# Patient Record
Sex: Male | Born: 1960 | Race: White | Hispanic: No | Marital: Married | Smoking: Never smoker
Health system: Southern US, Community
[De-identification: ages and names within clinical notes are randomized; demographics above are authoritative.]

## PROBLEM LIST (undated history)

## (undated) DIAGNOSIS — E785 Hyperlipidemia, unspecified: Secondary | ICD-10-CM

## (undated) DIAGNOSIS — C4491 Basal cell carcinoma of skin, unspecified: Secondary | ICD-10-CM

## (undated) DIAGNOSIS — C4492 Squamous cell carcinoma of skin, unspecified: Secondary | ICD-10-CM

## (undated) HISTORY — DX: Hyperlipidemia, unspecified: E78.5

## (undated) HISTORY — DX: Squamous cell carcinoma of skin, unspecified: C44.92

## (undated) HISTORY — DX: Basal cell carcinoma of skin, unspecified: C44.91

---

## 1989-10-30 DIAGNOSIS — C4491 Basal cell carcinoma of skin, unspecified: Secondary | ICD-10-CM

## 1989-10-30 HISTORY — DX: Basal cell carcinoma of skin, unspecified: C44.91

## 2009-03-26 HISTORY — PX: ARTHROSCOPIC REPAIR ACL: SUR80

## 2009-06-17 DIAGNOSIS — C4492 Squamous cell carcinoma of skin, unspecified: Secondary | ICD-10-CM

## 2009-06-17 HISTORY — DX: Squamous cell carcinoma of skin, unspecified: C44.92

## 2015-08-02 DIAGNOSIS — E7800 Pure hypercholesterolemia, unspecified: Secondary | ICD-10-CM | POA: Insufficient documentation

## 2015-08-02 DIAGNOSIS — E785 Hyperlipidemia, unspecified: Secondary | ICD-10-CM | POA: Insufficient documentation

## 2015-08-02 DIAGNOSIS — R7301 Impaired fasting glucose: Secondary | ICD-10-CM | POA: Insufficient documentation

## 2015-08-03 DIAGNOSIS — L659 Nonscarring hair loss, unspecified: Secondary | ICD-10-CM | POA: Insufficient documentation

## 2016-08-02 DIAGNOSIS — Z0000 Encounter for general adult medical examination without abnormal findings: Secondary | ICD-10-CM | POA: Diagnosis not present

## 2016-08-29 DIAGNOSIS — E7800 Pure hypercholesterolemia, unspecified: Secondary | ICD-10-CM | POA: Diagnosis not present

## 2016-08-29 DIAGNOSIS — L659 Nonscarring hair loss, unspecified: Secondary | ICD-10-CM | POA: Diagnosis not present

## 2016-08-29 DIAGNOSIS — Z0000 Encounter for general adult medical examination without abnormal findings: Secondary | ICD-10-CM | POA: Diagnosis not present

## 2017-03-31 DIAGNOSIS — Z23 Encounter for immunization: Secondary | ICD-10-CM | POA: Diagnosis not present

## 2017-05-17 ENCOUNTER — Other Ambulatory Visit: Payer: Self-pay

## 2017-05-17 DIAGNOSIS — Z125 Encounter for screening for malignant neoplasm of prostate: Secondary | ICD-10-CM

## 2017-05-17 DIAGNOSIS — Z0000 Encounter for general adult medical examination without abnormal findings: Secondary | ICD-10-CM

## 2017-05-17 DIAGNOSIS — Z13220 Encounter for screening for lipoid disorders: Secondary | ICD-10-CM

## 2017-06-07 ENCOUNTER — Other Ambulatory Visit: Payer: 59 | Admitting: Internal Medicine

## 2017-06-07 DIAGNOSIS — Z13220 Encounter for screening for lipoid disorders: Secondary | ICD-10-CM

## 2017-06-07 DIAGNOSIS — Z0000 Encounter for general adult medical examination without abnormal findings: Secondary | ICD-10-CM

## 2017-06-07 DIAGNOSIS — Z125 Encounter for screening for malignant neoplasm of prostate: Secondary | ICD-10-CM

## 2017-06-08 LAB — CBC WITH DIFFERENTIAL/PLATELET
BASOS PCT: 0.6 %
Basophils Absolute: 22 cells/uL (ref 0–200)
Eosinophils Absolute: 101 cells/uL (ref 15–500)
Eosinophils Relative: 2.8 %
HCT: 45 % (ref 38.5–50.0)
Hemoglobin: 15.7 g/dL (ref 13.2–17.1)
Lymphs Abs: 1325 cells/uL (ref 850–3900)
MCH: 29.7 pg (ref 27.0–33.0)
MCHC: 34.9 g/dL (ref 32.0–36.0)
MCV: 85.1 fL (ref 80.0–100.0)
MONOS PCT: 12.5 %
MPV: 10.9 fL (ref 7.5–12.5)
NEUTROS PCT: 47.3 %
Neutro Abs: 1703 cells/uL (ref 1500–7800)
PLATELETS: 219 10*3/uL (ref 140–400)
RBC: 5.29 10*6/uL (ref 4.20–5.80)
RDW: 13 % (ref 11.0–15.0)
TOTAL LYMPHOCYTE: 36.8 %
WBC mixed population: 450 cells/uL (ref 200–950)
WBC: 3.6 10*3/uL — AB (ref 3.8–10.8)

## 2017-06-08 LAB — LIPID PANEL
Cholesterol: 134 mg/dL (ref <200)
HDL: 40 mg/dL — ABNORMAL LOW (ref >40)
LDL Cholesterol (Calc): 80 mg/dL (calc)
NON-HDL CHOLESTEROL (CALC): 94 mg/dL (ref <130)
Total CHOL/HDL Ratio: 3.4 (calc) (ref <5.0)
Triglycerides: 49 mg/dL (ref <150)

## 2017-06-08 LAB — PSA: PSA: 0.1 ng/mL (ref <=4.0)

## 2017-06-08 LAB — COMPLETE METABOLIC PANEL WITH GFR
AG Ratio: 2.4 (calc) (ref 1.0–2.5)
ALT: 25 U/L (ref 9–46)
AST: 20 U/L (ref 10–35)
Albumin: 4.5 g/dL (ref 3.6–5.1)
Alkaline phosphatase (APISO): 56 U/L (ref 40–115)
BUN: 18 mg/dL (ref 7–25)
CALCIUM: 9.8 mg/dL (ref 8.6–10.3)
CHLORIDE: 104 mmol/L (ref 98–110)
CO2: 28 mmol/L (ref 20–32)
CREATININE: 1.14 mg/dL (ref 0.70–1.33)
GFR, EST AFRICAN AMERICAN: 83 mL/min/{1.73_m2} (ref >=60)
GFR, Est Non African American: 72 mL/min/{1.73_m2} (ref >=60)
Globulin: 1.9 g/dL (calc) (ref 1.9–3.7)
Glucose, Bld: 101 mg/dL — ABNORMAL HIGH (ref 65–99)
Potassium: 4.6 mmol/L (ref 3.5–5.3)
Sodium: 139 mmol/L (ref 135–146)
TOTAL PROTEIN: 6.4 g/dL (ref 6.1–8.1)
Total Bilirubin: 0.9 mg/dL (ref 0.2–1.2)

## 2017-06-11 ENCOUNTER — Ambulatory Visit (INDEPENDENT_AMBULATORY_CARE_PROVIDER_SITE_OTHER): Payer: 59 | Admitting: Internal Medicine

## 2017-06-11 ENCOUNTER — Encounter: Payer: Self-pay | Admitting: Internal Medicine

## 2017-06-11 VITALS — BP 120/80 | HR 80 | Ht 73.0 in | Wt 200.0 lb

## 2017-06-11 DIAGNOSIS — Z23 Encounter for immunization: Secondary | ICD-10-CM | POA: Diagnosis not present

## 2017-06-11 DIAGNOSIS — Z0000 Encounter for general adult medical examination without abnormal findings: Secondary | ICD-10-CM | POA: Diagnosis not present

## 2017-06-11 LAB — POCT URINALYSIS DIPSTICK
APPEARANCE: NORMAL
BILIRUBIN UA: NEGATIVE
Glucose, UA: NEGATIVE
Ketones, UA: NEGATIVE
Leukocytes, UA: NEGATIVE
NITRITE UA: NEGATIVE
ODOR: NORMAL
PROTEIN UA: NEGATIVE
Spec Grav, UA: 1.015 (ref 1.010–1.025)
UROBILINOGEN UA: 0.2 U/dL
pH, UA: 6.5 (ref 5.0–8.0)

## 2017-06-11 NOTE — Patient Instructions (Signed)
Pleasure to see you today.  Continue Lipitor and return in 1 year or as needed.  Tetanus immunization update given today.

## 2017-06-11 NOTE — Progress Notes (Signed)
   Subjective:    Patient ID: Steve Huynh, male    DOB: 09-22-1960, 58 y.o.   MRN: [id number]  HPI 57 year old male presents to the office for the first time history of hyperlipidemia treated with Lipitor.  History of low HDL.  General health is excellent.  He had a colonoscopy around age 62 by Dr. Watt Climes with 10-year follow-up recommended.  Past medical history no history of serious illnesses or accidents.  Had a torn ACL repair in 2011.  Current medications include Lipitor 40 mg daily, aspirin daily and Propecia 1 mg daily.  Social history: He is married.  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  Does not smoke.  One daughter and 2 sons.  Family history: Father died at age 22 due to renal failure.  Mother living age 21 in good health.  3 brothers in good health.  Review of lab work shows normal lipid panel on current dose of Lipitor and normal liver functions.  He has a low HDL at 40.  PSA is normal.    Review of Systems  Constitutional: Negative.   All other systems reviewed and are negative.      Objective:   Physical Exam  Constitutional: He is oriented to person, place, and time. He appears well-developed and well-nourished. No distress.  HENT:  Head: Normocephalic and atraumatic.  Right Ear: External ear normal.  Left Ear: External ear normal.  Nose: Nose normal.  Mouth/Throat: Oropharynx is clear and moist.  Eyes: Conjunctivae are normal. Right eye exhibits no discharge. Left eye exhibits no discharge. No scleral icterus.  Neck: Neck supple. No JVD present. No thyromegaly present.  Cardiovascular: Normal rate, regular rhythm, normal heart sounds and intact distal pulses.  No murmur heard. No carotid bruits  Pulmonary/Chest: Effort normal and breath sounds normal. He has no wheezes. He has no rales.  Abdominal: Soft. Bowel sounds are normal. He exhibits no distension and no mass. There is no  tenderness. There is no rebound and no guarding.  Genitourinary: Prostate normal.  Genitourinary Comments: Prostate is normal without nodules  Musculoskeletal: He exhibits no edema.  Lymphadenopathy:    He has no cervical adenopathy.  Neurological: He is alert and oriented to person, place, and time. He has normal reflexes. No cranial nerve deficit. Coordination normal.  Skin: Skin is warm and dry. No rash noted. He is not diaphoretic.  Psychiatric: He has a normal mood and affect. His behavior is normal. Judgment and thought content normal.  Vitals reviewed.         Assessment & Plan:  History of hyperlipidemia on Lipitor with normal lipid panel  Low HDL cholesterol  Plan: His general health is excellent and he watches his weight and works out.  Continue same dose of Lipitor and return in 1 year or as needed.  Tetanus immunization update given today.  Order given for Zoster vaccine should he desire to get that at local pharmacy.

## 2017-08-15 ENCOUNTER — Telehealth: Payer: Self-pay | Admitting: Internal Medicine

## 2017-08-15 ENCOUNTER — Ambulatory Visit
Admission: RE | Admit: 2017-08-15 | Discharge: 2017-08-15 | Disposition: A | Discharge status: Home / Self Care | Payer: 59 | Source: Ambulatory Visit | Attending: Internal Medicine | Admitting: Internal Medicine

## 2017-08-15 ENCOUNTER — Ambulatory Visit: Payer: 59 | Admitting: Internal Medicine

## 2017-08-15 VITALS — BP 110/78 | HR 118 | Temp 98.4°F | Ht 73.0 in | Wt 200.0 lb

## 2017-08-15 DIAGNOSIS — R52 Pain, unspecified: Secondary | ICD-10-CM

## 2017-08-15 DIAGNOSIS — R05 Cough: Secondary | ICD-10-CM | POA: Diagnosis not present

## 2017-08-15 DIAGNOSIS — M791 Myalgia, unspecified site: Secondary | ICD-10-CM

## 2017-08-15 DIAGNOSIS — R059 Cough, unspecified: Secondary | ICD-10-CM

## 2017-08-15 DIAGNOSIS — B349 Viral infection, unspecified: Secondary | ICD-10-CM

## 2017-08-15 LAB — CBC WITH DIFFERENTIAL/PLATELET
BASOS PCT: 0.2 %
Basophils Absolute: 17 cells/uL (ref 0–200)
EOS PCT: 1.1 %
Eosinophils Absolute: 94 cells/uL (ref 15–500)
HEMATOCRIT: 45.4 % (ref 38.5–50.0)
Hemoglobin: 16.1 g/dL (ref 13.2–17.1)
LYMPHS ABS: 1301 {cells}/uL (ref 850–3900)
MCH: 30.1 pg (ref 27.0–33.0)
MCHC: 35.5 g/dL (ref 32.0–36.0)
MCV: 84.9 fL (ref 80.0–100.0)
MPV: 10.6 fL (ref 7.5–12.5)
Monocytes Relative: 9.3 %
Neutro Abs: 6299 cells/uL (ref 1500–7800)
Neutrophils Relative %: 74.1 %
PLATELETS: 206 10*3/uL (ref 140–400)
RBC: 5.35 10*6/uL (ref 4.20–5.80)
RDW: 12.5 % (ref 11.0–15.0)
Total Lymphocyte: 15.3 %
WBC: 8.5 10*3/uL (ref 3.8–10.8)
WBCMIX: 791 {cells}/uL (ref 200–950)

## 2017-08-15 LAB — POCT INFLUENZA A/B
INFLUENZA B, POC: NEGATIVE
Influenza A, POC: NEGATIVE

## 2017-08-15 MED ORDER — CEFTRIAXONE SODIUM 1 G IJ SOLR
1.0000 g | Freq: Once | INTRAMUSCULAR | Status: AC
  Administered 2017-08-15: 1 g via INTRAMUSCULAR

## 2017-08-15 MED ORDER — AZITHROMYCIN 250 MG PO TABS: ORAL_TABLET | ORAL | 0 refills | Status: DC

## 2017-08-15 NOTE — Telephone Encounter (Signed)
Vinicius Brockman Spouse [phone]  Shirlean Mylar called to say that Steve Huynh is having flu like symptoms since yesterday, but started getting a cold on Saturday. He came home from work yesterday saying that he just does not feel good, he feels like he may have fever, she is on the way to drug store to get a thermometer, he also has a dry cough, achy all over feels really tired. Can come in anytime.

## 2017-08-15 NOTE — Telephone Encounter (Signed)
Called back and spoke with Robin and scheduled appointment

## 2017-08-15 NOTE — Telephone Encounter (Signed)
See afternoon 2 pm? Mask do not leave in waiting room

## 2017-08-18 ENCOUNTER — Encounter: Payer: Self-pay | Admitting: Internal Medicine

## 2017-08-18 NOTE — Patient Instructions (Signed)
Please go and have chest x-ray.  Take Zithromax Z-PAK 2 p.o. day 1 followed by 1 p.o. days 2 through 5.  Tylenol as needed for myalgias and headache.  Rocephin 1 g IM given in office.  Lab work drawn and pending.  Rapid flu test is negative.

## 2017-08-18 NOTE — Progress Notes (Signed)
   Subjective:    Patient ID: Steve Huynh, male    DOB: 1960/07/22, 57 y.o.   MRN: [id number]  HPI Steve Huynh recently traveled to [place] and returning home began to have malaise, myalgias, chills and cough.  Today he feels worse not better.  No energy.  Rapid flu test is negative.  CBC with differential is within normal limits and chest x-ray is negative for pneumonia.  Not sleeping well due to cough ,malaise and myalgias.    Review of Systems see above     Objective:   Physical Exam He looks fatigued.  Skin warm and dry.  Nodes none.  Pharynx very slightly injected without exudate.  TMs are clear.  Neck is supple.  Chest clear to auscultation.       Assessment & Plan:  Acute viral syndrome with early secondary bacterial infection  Plan: 1 g IM Rocephin.  Zithromax Z-Pak take 2 tabs day 1 followed by 1 tablet days 2 through 5.  Rest and drink plenty of fluids.  Patient contacted with results of lab work and chest x-ray after visit by voicemail.

## 2017-11-10 DIAGNOSIS — Z23 Encounter for immunization: Secondary | ICD-10-CM | POA: Diagnosis not present

## 2018-01-08 ENCOUNTER — Other Ambulatory Visit: Payer: Self-pay | Admitting: Internal Medicine

## 2018-01-15 DIAGNOSIS — L570 Actinic keratosis: Secondary | ICD-10-CM | POA: Diagnosis not present

## 2018-01-27 ENCOUNTER — Telehealth: Payer: Self-pay | Admitting: Emergency Medicine

## 2018-01-27 NOTE — Telephone Encounter (Signed)
Pt called and stated he needs a PA for his medications atorvastatin (LIPITOR) 40 MG tablet and finasteride (PROPECIA) 1 MG tablet. Pharmacy is [facility] on [address]. Thanks.

## 2018-01-27 NOTE — Telephone Encounter (Signed)
Please see if this is possible. If cannot get Propecia approved for hair loss, will need to see his Dermatologist

## 2018-01-29 ENCOUNTER — Telehealth: Payer: Self-pay | Admitting: Internal Medicine

## 2018-01-29 MED ORDER — ATORVASTATIN CALCIUM 40 MG PO TABS: 40.0000 mg | ORAL_TABLET | Freq: Every day | ORAL | 5 refills | Status: DC

## 2018-01-29 MED ORDER — FINASTERIDE 1 MG PO TABS: 1.0000 mg | ORAL_TABLET | Freq: Every day | ORAL | 5 refills | Status: DC

## 2018-01-29 NOTE — Telephone Encounter (Signed)
Patient calling for a refill on Lipitor and Propecia.  He had his CPE on March.  States to return in 1 year.  Can we go ahead and send these 2 Rx's in for him?      Pharmacy:  [facility] @ [place].    Phone:  [phone]  Thank you.

## 2018-01-29 NOTE — Telephone Encounter (Signed)
Sharyn Lull, we received these requests on Monday. Please ask [facility] to handle. It should have been handled by now.

## 2018-01-29 NOTE — Telephone Encounter (Signed)
Steve Huynh, will you please send these 2 Rx's for patient?    Thank you.  I'll call the patient and let him know that they have been sent.

## 2018-03-04 DIAGNOSIS — D229 Melanocytic nevi, unspecified: Secondary | ICD-10-CM | POA: Diagnosis not present

## 2018-03-04 DIAGNOSIS — L570 Actinic keratosis: Secondary | ICD-10-CM | POA: Diagnosis not present

## 2018-04-06 DIAGNOSIS — R69 Illness, unspecified: Secondary | ICD-10-CM | POA: Diagnosis not present

## 2018-04-06 DIAGNOSIS — R05 Cough: Secondary | ICD-10-CM | POA: Diagnosis not present

## 2018-04-07 ENCOUNTER — Encounter: Payer: Self-pay | Admitting: Internal Medicine

## 2018-04-07 ENCOUNTER — Ambulatory Visit (INDEPENDENT_AMBULATORY_CARE_PROVIDER_SITE_OTHER): Payer: 59 | Admitting: Internal Medicine

## 2018-04-07 VITALS — BP 140/90 | HR 122 | Temp 99.5°F | Ht 73.0 in | Wt 194.0 lb

## 2018-04-07 DIAGNOSIS — R509 Fever, unspecified: Secondary | ICD-10-CM | POA: Diagnosis not present

## 2018-04-07 DIAGNOSIS — J111 Influenza due to unidentified influenza virus with other respiratory manifestations: Secondary | ICD-10-CM | POA: Diagnosis not present

## 2018-04-07 DIAGNOSIS — R0989 Other specified symptoms and signs involving the circulatory and respiratory systems: Secondary | ICD-10-CM | POA: Diagnosis not present

## 2018-04-07 DIAGNOSIS — R52 Pain, unspecified: Secondary | ICD-10-CM | POA: Diagnosis not present

## 2018-04-07 LAB — CBC WITH DIFFERENTIAL/PLATELET
ABSOLUTE MONOCYTES: 1254 {cells}/uL — AB (ref 200–950)
Basophils Absolute: 8 cells/uL (ref 0–200)
Basophils Relative: 0.1 %
Eosinophils Absolute: 0 cells/uL — ABNORMAL LOW (ref 15–500)
Eosinophils Relative: 0 %
HCT: 46.8 % (ref 38.5–50.0)
Hemoglobin: 16.1 g/dL (ref 13.2–17.1)
Lymphs Abs: 1110 cells/uL (ref 850–3900)
MCH: 29.7 pg (ref 27.0–33.0)
MCHC: 34.4 g/dL (ref 32.0–36.0)
MCV: 86.2 fL (ref 80.0–100.0)
MPV: 11.4 fL (ref 7.5–12.5)
Monocytes Relative: 16.5 %
Neutro Abs: 5229 cells/uL (ref 1500–7800)
Neutrophils Relative %: 68.8 %
Platelets: 188 10*3/uL (ref 140–400)
RBC: 5.43 10*6/uL (ref 4.20–5.80)
RDW: 12.6 % (ref 11.0–15.0)
Total Lymphocyte: 14.6 %
WBC: 7.6 10*3/uL (ref 3.8–10.8)

## 2018-04-07 LAB — POCT INFLUENZA A/B
Influenza A, POC: NEGATIVE
Influenza B, POC: NEGATIVE

## 2018-04-07 MED ORDER — HYDROCODONE-HOMATROPINE 5-1.5 MG/5ML PO SYRP: 5.0000 mL | ORAL_SOLUTION | Freq: Three times a day (TID) | ORAL | 0 refills | Status: DC | PRN

## 2018-04-07 MED ORDER — DOXYCYCLINE HYCLATE 100 MG PO TABS: 100.0000 mg | ORAL_TABLET | Freq: Two times a day (BID) | ORAL | 0 refills | Status: DC

## 2018-04-07 MED ORDER — CEFTRIAXONE SODIUM 1 G IJ SOLR
1.0000 g | Freq: Once | INTRAMUSCULAR | Status: AC
  Administered 2018-04-07: 1 g via INTRAMUSCULAR

## 2018-04-07 NOTE — Patient Instructions (Addendum)
Rocephin 1 g IM.  Doxycycline 100 mg twice daily for 10 days.  Hycodan 1 teaspoon p.o. every 8 hours as needed cough.  Tylenol as needed for fever.  Rest and drink plenty of fluids.  Have chest x-ray.  CBC pending.

## 2018-04-07 NOTE — Progress Notes (Signed)
   Subjective:    Patient ID: Steve Huynh, male    DOB: 14-Aug-1960, 58 y.o.   MRN: [id number]  HPI 58 year old Male in with headache and cough onset  this past Friday before leaving [place], [place] where he and his wife were vacationing.  He also has had myalgias.  He has slept a great deal of the past 2 days having some difficulty getting back from [place] due to airplane delays.  Has deep congested cough without much sputum production.  He went to [facility] yesterday and was placed on anti-flu medication.  He started that.  They did not have a rapid flu test as they were out of stock.  His rapid flu test today here is negative.  He was placed on Xofluza  40 mg to take 2 tablets for one-time dosing which he did yesterday.   Review of Systems no nausea and vomiting.  Has malaise and fatigue.  Has headache.  Sinuses feel full.     Objective:   Physical Exam Skin warm and dry.  He sounds nasally congested.  He has a deep congested cough.  Vital signs reviewed.  Pulse is 122.  Temperature is 99.5 degrees orally.  Pulse oximetry is 99%.  Skin warm and dry.  Nodes none.  Left TM is slightly full.  Pharynx without exudate.  Neck is supple.  Chest: Inspiratory rales right lower lobe.  Chest x-ray ordered.       Assessment & Plan:  I suspect he has influenza.  He has been treated at [facility] yesterday.  He actually had onset of symptoms on January 10.  I think he may have pneumonia in the right lower lobe.  He will have chest x-ray.  Gave him 1 g IM Rocephin and started him on doxycycline 100 mg twice daily for 10 days.  Hycodan 1 teaspoon p.o. every 8 hours as needed cough and myalgias.  Tylenol for fever.  Rest and drink plenty of fluids.

## 2018-04-08 ENCOUNTER — Ambulatory Visit
Admission: RE | Admit: 2018-04-08 | Discharge: 2018-04-08 | Disposition: A | Discharge status: Home / Self Care | Payer: 59 | Source: Ambulatory Visit | Attending: Internal Medicine | Admitting: Internal Medicine

## 2018-04-08 DIAGNOSIS — R05 Cough: Secondary | ICD-10-CM | POA: Diagnosis not present

## 2018-04-22 ENCOUNTER — Other Ambulatory Visit: Payer: Self-pay

## 2018-04-22 MED ORDER — AZITHROMYCIN 250 MG PO TABS: ORAL_TABLET | ORAL | 0 refills | Status: DC

## 2018-04-22 NOTE — Telephone Encounter (Signed)
Please refill.

## 2018-04-22 NOTE — Telephone Encounter (Signed)
Patient is calling to let you know that he is still coughing and would like to know if you could send in a zpak for him, he said his wife talked to you about this.

## 2018-05-06 ENCOUNTER — Other Ambulatory Visit: Payer: Self-pay

## 2018-05-06 MED ORDER — AZITHROMYCIN 250 MG PO TABS: ORAL_TABLET | ORAL | 0 refills | Status: DC

## 2018-06-18 ENCOUNTER — Encounter: Payer: Self-pay | Admitting: *Deleted

## 2018-07-21 ENCOUNTER — Other Ambulatory Visit: Payer: Self-pay | Admitting: Internal Medicine

## 2018-10-03 ENCOUNTER — Ambulatory Visit (INDEPENDENT_AMBULATORY_CARE_PROVIDER_SITE_OTHER): Payer: 59 | Admitting: Internal Medicine

## 2018-10-03 ENCOUNTER — Other Ambulatory Visit: Payer: Self-pay | Admitting: Internal Medicine

## 2018-10-03 ENCOUNTER — Telehealth: Payer: Self-pay | Admitting: Internal Medicine

## 2018-10-03 DIAGNOSIS — Z20828 Contact with and (suspected) exposure to other viral communicable diseases: Secondary | ICD-10-CM | POA: Diagnosis not present

## 2018-10-03 DIAGNOSIS — Z20822 Contact with and (suspected) exposure to covid-19: Secondary | ICD-10-CM

## 2018-10-03 NOTE — Telephone Encounter (Signed)
Spoke with Steve Huynh at [place] testing site and she put in order and arranged testing for Monday at 8:30 for Dr Renold Genta. Patient was exposed after with spending week at beach with nephew that tested positive for COVID.

## 2018-10-03 NOTE — Telephone Encounter (Signed)
Spoke with Steve Huynh about his being exposed to COVID thru being at [employer] with his nephew. Arranged virtual visit to discuss with Dr Renold Genta.

## 2018-10-06 ENCOUNTER — Encounter: Payer: Self-pay | Admitting: Internal Medicine

## 2018-10-06 ENCOUNTER — Other Ambulatory Visit: Payer: 59

## 2018-10-06 ENCOUNTER — Other Ambulatory Visit: Payer: Self-pay | Admitting: Internal Medicine

## 2018-10-06 DIAGNOSIS — Z20822 Contact with and (suspected) exposure to covid-19: Secondary | ICD-10-CM

## 2018-10-06 NOTE — Progress Notes (Signed)
   Subjective:    Patient ID: Steve Huynh, male    DOB: 1960-06-11, 58 y.o.   MRN: [id number]  HPI 58 year old Male seen today by interactive audio and video telecommunications due to the coronavirus pandemic.  Patient is identified using 2 identifiers as "went Yaney, a patient in this practice.  He is agreeable to visit in this format today as he is at the beach.  Patient was at the beach with his family staying in a beach home for several days and apparently his wife's nephew came from [place] subsequently developing symptoms of COVID-19 during their stay.  Initially nephew thought he had an ear infection but came down with full-blown symptoms of fever chills myalgias on Sunday, July 5 and returned to [place].  Patient and his entire family were exposed to this nephew as he was residing at their beach home.  He did stay in a separate part of the house but was around them some of the time.  Mr. Bubeck is currently asymptomatic but would like to be tested.    Review of Systems no nausea vomiting diarrhea headache fever chills or myalgias     Objective:   Physical Exam Seen virtually in no acute distress       Assessment & Plan:  COVID-19 exposure  Plan: Patient will be sent for COVID-19 testing at test center on [facility].  15 minutes spent with patient including medical decision making, history taking and arranging for testing at [facility] center.

## 2018-10-06 NOTE — Patient Instructions (Signed)
Arrange for testing at [facility] test center for COVID-19.  Quarantine until results are back.

## 2018-10-10 ENCOUNTER — Encounter: Payer: Self-pay | Admitting: Internal Medicine

## 2018-10-10 ENCOUNTER — Telehealth: Payer: Self-pay | Admitting: Internal Medicine

## 2018-10-10 LAB — NOVEL CORONAVIRUS, NAA: SARS-CoV-2, NAA: NOT DETECTED

## 2018-10-10 NOTE — Telephone Encounter (Signed)
Call to pt. Covid 19 result is negative

## 2019-01-19 ENCOUNTER — Other Ambulatory Visit: Payer: Self-pay

## 2019-01-19 ENCOUNTER — Telehealth: Payer: Self-pay | Admitting: Internal Medicine

## 2019-01-19 MED ORDER — ATORVASTATIN CALCIUM 40 MG PO TABS: ORAL_TABLET | ORAL | 5 refills | Status: DC

## 2019-01-19 MED ORDER — FINASTERIDE 1 MG PO TABS: ORAL_TABLET | ORAL | 5 refills | Status: DC

## 2019-01-19 NOTE — Telephone Encounter (Addendum)
Received Fax RX request from  [place]  Medication - finasteride (PROPECIA) 1 MG tablet /  atorvastatin (LIPITOR) 40 MG tablet    Last Refill - 12/19/18  Last OV - 04/07/18  Last CPE - 06/11/17

## 2019-03-18 IMAGING — CR DG CHEST 2V
2 series · 2 of 2 positions shown · non-contrast
Comparison: None.

CLINICAL DATA: Productive cough, fever.

EXAM:
CHEST - 2 VIEW

[w chest pa]
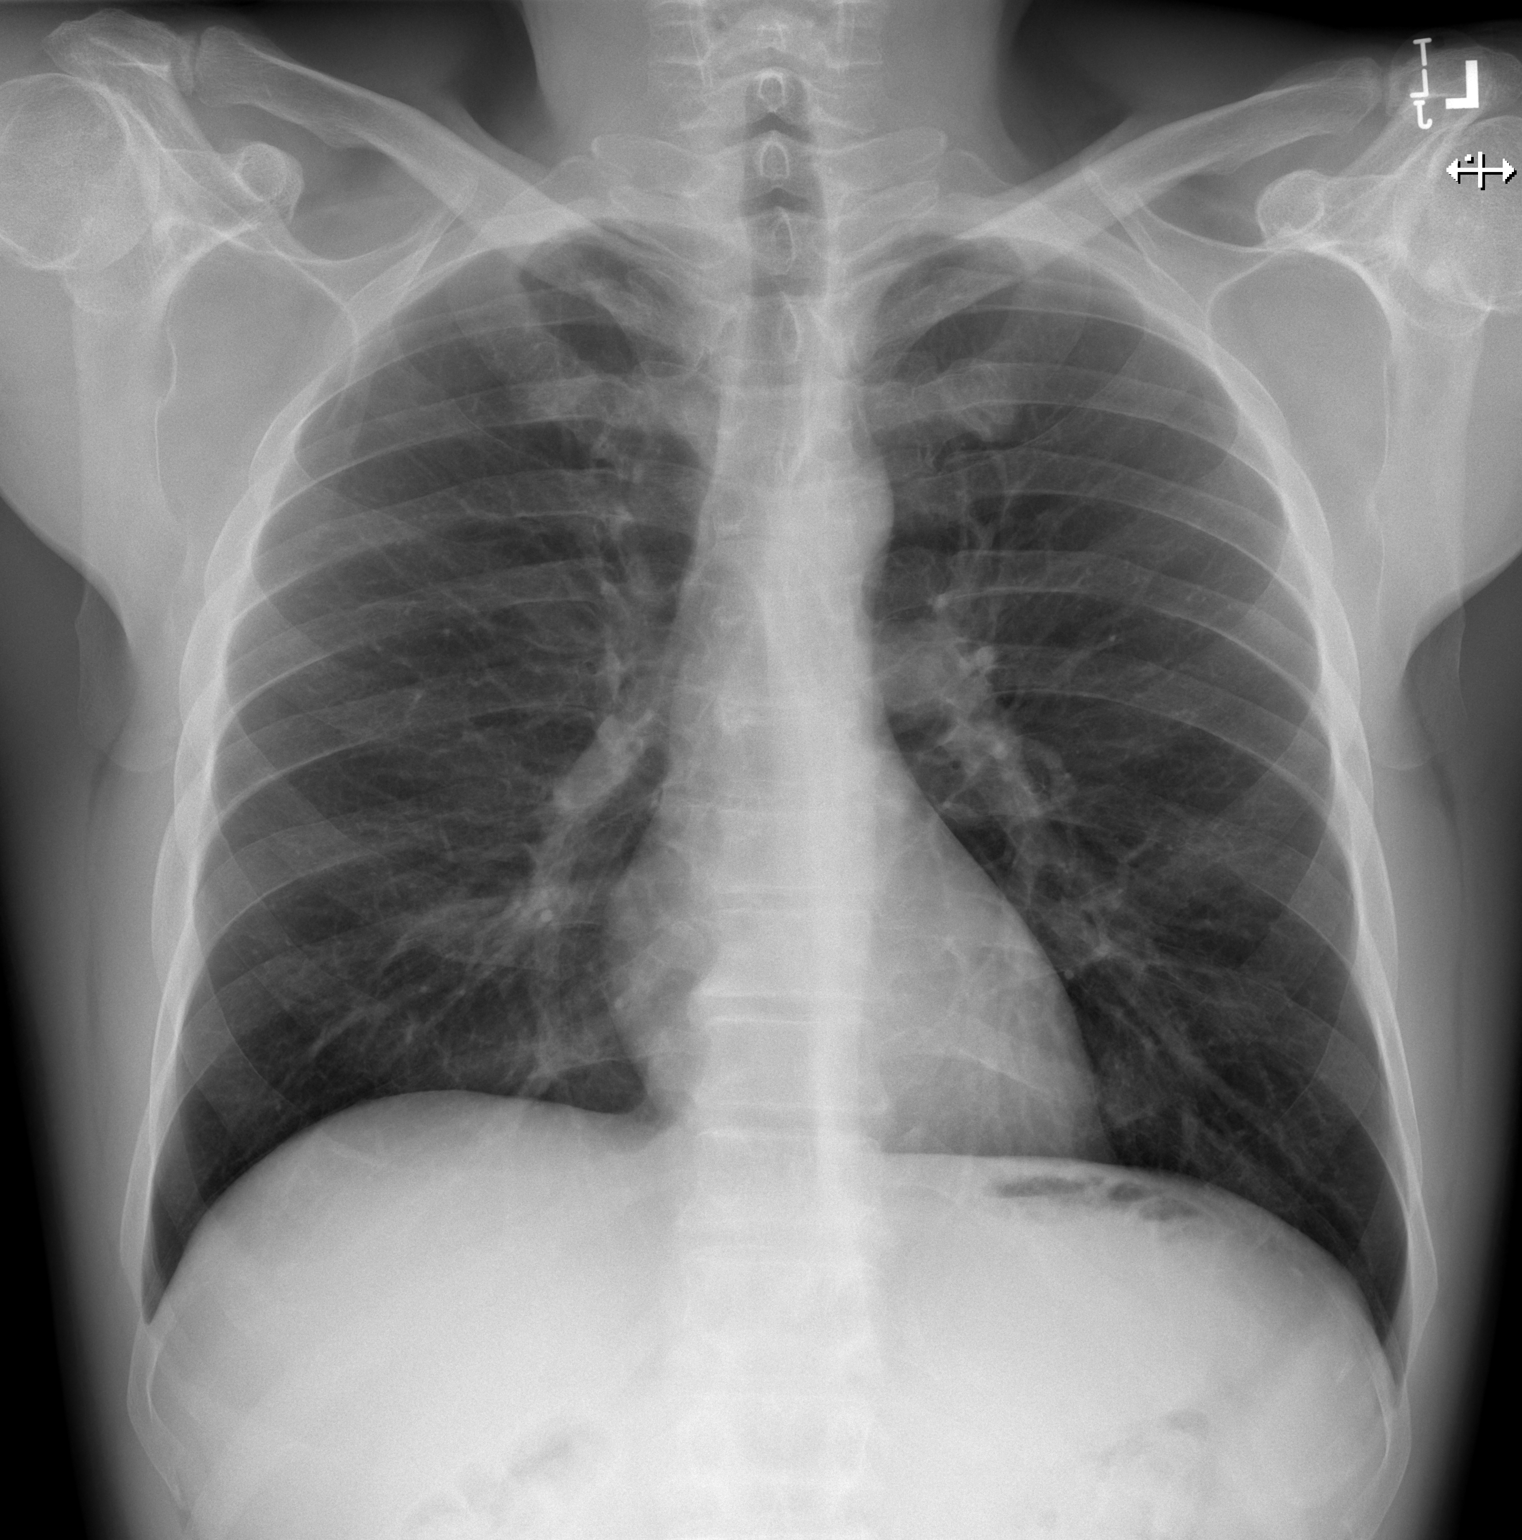

[w chest lat]
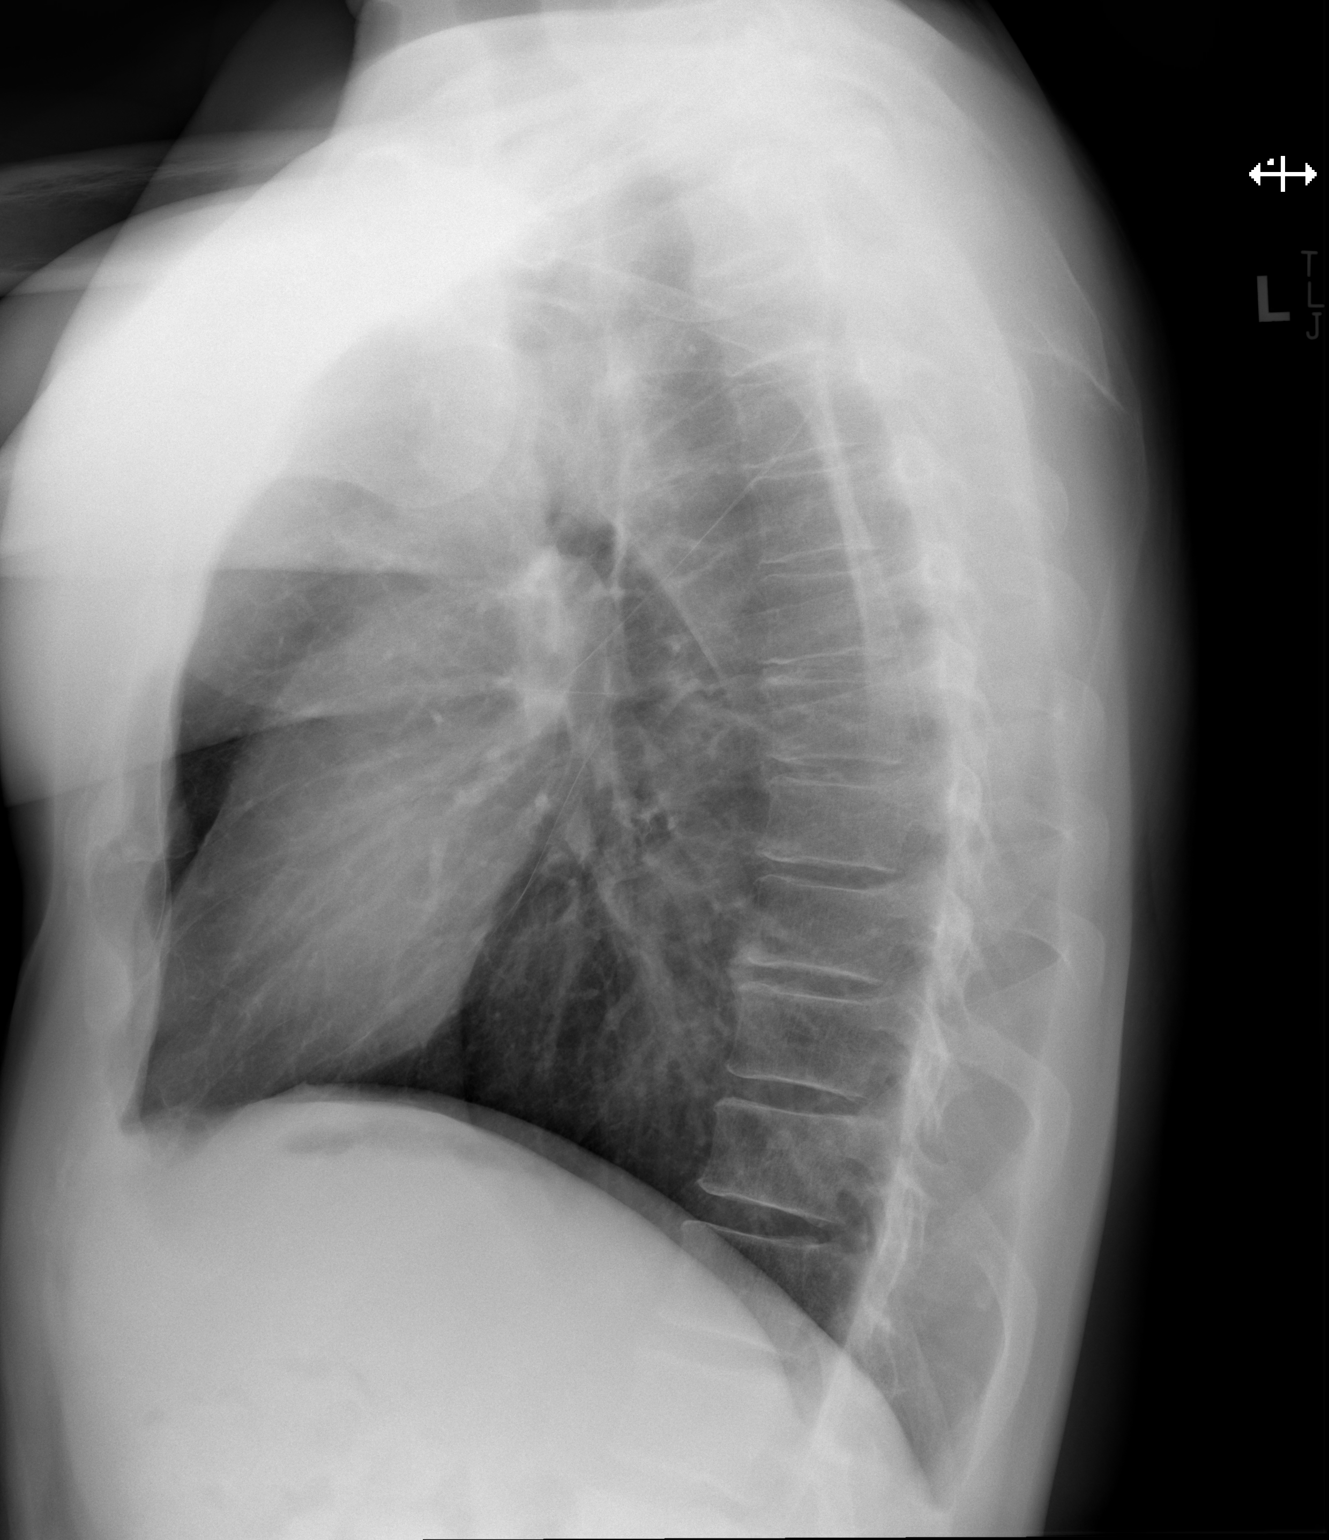

[2 of 2 positions shown; findings below may reference images not displayed]

FINDINGS: The heart size and mediastinal contours are within normal limits.
Both lungs are clear. No pneumothorax or pleural effusion is noted.
The visualized skeletal structures are unremarkable.
IMPRESSION: No active cardiopulmonary disease.

## 2019-04-21 ENCOUNTER — Other Ambulatory Visit: Payer: Self-pay

## 2019-04-21 ENCOUNTER — Other Ambulatory Visit: Payer: 59 | Admitting: Internal Medicine

## 2019-04-21 DIAGNOSIS — Z13220 Encounter for screening for lipoid disorders: Secondary | ICD-10-CM

## 2019-04-21 DIAGNOSIS — Z125 Encounter for screening for malignant neoplasm of prostate: Secondary | ICD-10-CM

## 2019-04-21 DIAGNOSIS — R7309 Other abnormal glucose: Secondary | ICD-10-CM

## 2019-04-21 DIAGNOSIS — Z0000 Encounter for general adult medical examination without abnormal findings: Secondary | ICD-10-CM

## 2019-04-22 LAB — LIPID PANEL
Cholesterol: 122 mg/dL (ref <200)
HDL: 39 mg/dL — ABNORMAL LOW (ref >=40)
LDL Cholesterol (Calc): 70 mg/dL (calc)
Non-HDL Cholesterol (Calc): 83 mg/dL (calc) (ref <130)
Total CHOL/HDL Ratio: 3.1 (calc) (ref <5.0)
Triglycerides: 57 mg/dL (ref <150)

## 2019-04-22 LAB — CBC WITH DIFFERENTIAL/PLATELET
Absolute Monocytes: 460 cells/uL (ref 200–950)
Basophils Absolute: 20 cells/uL (ref 0–200)
Basophils Relative: 0.5 %
Eosinophils Absolute: 140 cells/uL (ref 15–500)
Eosinophils Relative: 3.6 %
HCT: 44.6 % (ref 38.5–50.0)
Hemoglobin: 15.2 g/dL (ref 13.2–17.1)
Lymphs Abs: 1669 cells/uL (ref 850–3900)
MCH: 30.3 pg (ref 27.0–33.0)
MCHC: 34.1 g/dL (ref 32.0–36.0)
MCV: 89 fL (ref 80.0–100.0)
MPV: 11.4 fL (ref 7.5–12.5)
Monocytes Relative: 11.8 %
Neutro Abs: 1611 cells/uL (ref 1500–7800)
Neutrophils Relative %: 41.3 %
Platelets: 198 10*3/uL (ref 140–400)
RBC: 5.01 10*6/uL (ref 4.20–5.80)
RDW: 12.2 % (ref 11.0–15.0)
Total Lymphocyte: 42.8 %
WBC: 3.9 10*3/uL (ref 3.8–10.8)

## 2019-04-22 LAB — PSA: PSA: <0.1 ng/mL (ref <=4.0)

## 2019-04-22 LAB — COMPLETE METABOLIC PANEL WITH GFR
AG Ratio: 2 (calc) (ref 1.0–2.5)
ALT: 24 U/L (ref 9–46)
AST: 22 U/L (ref 10–35)
Albumin: 4.3 g/dL (ref 3.6–5.1)
Alkaline phosphatase (APISO): 60 U/L (ref 35–144)
BUN: 18 mg/dL (ref 7–25)
CO2: 26 mmol/L (ref 20–32)
Calcium: 9.6 mg/dL (ref 8.6–10.3)
Chloride: 106 mmol/L (ref 98–110)
Creat: 1.12 mg/dL (ref 0.70–1.33)
GFR, Est African American: 84 mL/min/{1.73_m2} (ref >=60)
GFR, Est Non African American: 73 mL/min/{1.73_m2} (ref >=60)
Globulin: 2.1 g/dL (calc) (ref 1.9–3.7)
Glucose, Bld: 98 mg/dL (ref 65–99)
Potassium: 4.3 mmol/L (ref 3.5–5.3)
Sodium: 142 mmol/L (ref 135–146)
Total Bilirubin: 0.8 mg/dL (ref 0.2–1.2)
Total Protein: 6.4 g/dL (ref 6.1–8.1)

## 2019-04-22 LAB — HEMOGLOBIN A1C
Hgb A1c MFr Bld: 5.3 % of total Hgb (ref <5.7)
Mean Plasma Glucose: 105 (calc)
eAG (mmol/L): 5.8 (calc)

## 2019-04-23 ENCOUNTER — Ambulatory Visit (INDEPENDENT_AMBULATORY_CARE_PROVIDER_SITE_OTHER): Payer: 59 | Admitting: Internal Medicine

## 2019-04-23 ENCOUNTER — Encounter: Payer: Self-pay | Admitting: Internal Medicine

## 2019-04-23 ENCOUNTER — Other Ambulatory Visit: Payer: Self-pay

## 2019-04-23 VITALS — BP 140/90 | HR 73 | Temp 97.9°F | Ht 73.0 in | Wt 196.0 lb

## 2019-04-23 DIAGNOSIS — Z8249 Family history of ischemic heart disease and other diseases of the circulatory system: Secondary | ICD-10-CM | POA: Diagnosis not present

## 2019-04-23 DIAGNOSIS — Z0000 Encounter for general adult medical examination without abnormal findings: Secondary | ICD-10-CM

## 2019-04-23 DIAGNOSIS — Z8639 Personal history of other endocrine, nutritional and metabolic disease: Secondary | ICD-10-CM

## 2019-04-23 DIAGNOSIS — E785 Hyperlipidemia, unspecified: Secondary | ICD-10-CM

## 2019-04-23 DIAGNOSIS — E786 Lipoprotein deficiency: Secondary | ICD-10-CM | POA: Diagnosis not present

## 2019-04-23 LAB — POCT URINALYSIS DIPSTICK
Appearance: NEGATIVE
Bilirubin, UA: NEGATIVE
Blood, UA: NEGATIVE
Glucose, UA: NEGATIVE
Ketones, UA: NEGATIVE
Leukocytes, UA: NEGATIVE
Nitrite, UA: NEGATIVE
Odor: NEGATIVE
Protein, UA: NEGATIVE
Spec Grav, UA: 1.01 (ref 1.010–1.025)
Urobilinogen, UA: 0.2 E.U./dL
pH, UA: 6.5 (ref 5.0–8.0)

## 2019-04-23 NOTE — Patient Instructions (Addendum)
It was a pleasure to see you today.  Continue lipid-lowering medication.  Return in 1 year or as needed.

## 2019-04-23 NOTE — Progress Notes (Addendum)
Subjective:    Patient ID: Steve Huynh, male    DOB: 1961-01-18, 59 y.o.   MRN: [id number]  HPI pleasant 59 year old male in today for health maintenance exam and evaluation of medical issues.  He feels well and has no complaints.  His general health is excellent.  He recently saw Dr. Morton Stall at [facility] clinic in [place] on January 19.  He was seen by an exercise physiologist to review his exercise plan.  He was seen by dietitian and behavioral therapist.  This was a corporate sponsored comprehensive health exam.  He has takes Lipitor 40 mg daily and Propecia he exercises regularly.  Social history: Married.  1 daughter and 2 sons.  Social alcohol consumption, typically during social events only.  Non-smoker.  He works out with a Physiological scientist twice a week.  Enjoys skiing.  Recently was in [place] skiing over the holidays.  He has a degree in IT sales professional from [employer] and an [employer] [facility] from [employer].  His daughter is 54 years old and he has 2 sons ages 82 and 54. He is owner of [employer].  Records indicate he started on Lipitor in 2007 after previous physical.  Had ACL repair from skiing accident some 10 years ago.  There is history of basal cell carcinoma of skin followed by dermatology on a regular basis.  Family history: Father died at age 89 with renal failure with history of coronary artery disease.  Father had angioplasty at 88 and stroke at 41.  Mother living age 64 in good health.  3 brothers, an older brother who is in good health and twin brothers age 46 in good health.  Recently there was a chest x-ray done at [facility] during his executive physical and there was a possible left upper lobe nodule but dual-energy chest radiography evaluated this showing upper lobe nodule at all.  EKG was normal.  Flu and tetanus immunizations are up-to-date.  In July he had close exposure to COVID-19 as his wife nephew came from [place] to stay with him at [employer] and came down with COVID-19.  Mr. Winski tested negative but one of his sons tested positive but was basically asymptomatic.  He was diagnosed with influenza in January 2020.    Review of Systems  Constitutional: Negative.   Respiratory: Negative.   Cardiovascular: Negative.   Gastrointestinal: Negative.   Genitourinary: Negative.   Neurological: Negative.   Psychiatric/Behavioral: Negative.        Objective:   Physical Exam Blood pressure 140/90 BMI 25.86 weight 196 pounds pulse 73 temperature 97.9 degrees pulse oximetry 97% height 6 feet 1 inches skin warm and dry.  Nodes none.  TMs are clear.  Neck is supple without JVD thyromegaly or carotid bruits.  Chest clear to auscultation.  Cardiac exam regular rate and rhythm normal S1 and S2.  Abdomen no hepatosplenomegaly masses or tenderness.  Extremities no deformity.  No lower extremity edema.  Neuro no focal deficits on brief neurological exam.  Prostate is slightly boggy but no nodules appreciated.  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  He exercises regularly which is helpful.  He remains on statin medication due to family history and history of hyperlipidemia.  Hemoglobin A1c PSA CBC C met are reviewed and are all within normal limits.       Assessment & Plan:  History of hyperlipidemia   low HDL cholesterol  History of basal cell carcinoma  followed by Dr. Denna Haggard.  Had actinic keratoses removed December 2019.  Family history of coronary disease in father  Plan: He seems to be in excellent physical condition.  Can consider coronary calcium score in the near future with family history of heart disease and hyperlipidemia.  Return in 1 year or as needed.  I have contacted him January 20 and he is agreeable to seeing Cardiology for CT calcium score. Will make referral.

## 2019-04-26 NOTE — Progress Notes (Signed)
Cardiology Office Note:    Date:  04/27/2019   ID:  Steve Huynh, DOB Dec 18, 1960, MRN [id number]  PCP:  Elby Showers, MD  Cardiologist:  No primary care provider on file.  Electrophysiologist:  None   Referring MD: Elby Showers, MD   Chief Complaint  Patient presents with  . Hyperlipidemia    History of Present Illness:    Steve Huynh is a 59 y.o. male with a hx of basal cell carcinoma, hyperlipidemia, family history of CAD who is referred by Dr. Renold Genta for cardiovascular risk assessment.  He reports that he was started on atorvastatin 80 mg daily in 2007 for hyperlipidemia.  States that cholesterol has been well controlled since that time and his dose has been decreased to 40 mg daily.  He exercises regularly, and does resistance training 2 days/week and then jogs or hikes 2 days/week.  Denies any exertional chest pain or dyspnea.  No smoking history.  Father underwent PCI in his early 33s and had a stroke at age 7.  Reports BP is well controlled at home (120s over 80s).    Past Medical History:  Diagnosis Date  . BCC (basal cell carcinoma) 10/30/1989   vertex of scalp  . BCC (basal cell carcinoma) 07/24/1995   vertex  . BCC (basal cell carcinoma) sup 12/27/2016   right shin  . BCC (basal cell carcinoma) sup and micronod 12/27/2016   right anterior scalp  . BCC (basal cell carcinoma) super nod 10/07/2013   mid back  . BCC (basal cell carcinoma) superficial 07/24/1995   Left anterior shoulder  . Hyperlipidemia   . 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 SCC (squamous cell carcinoma) in situ 05/02/2010   center chest    Past Surgical History:  Procedure Laterality Date  . ARTHROSCOPIC REPAIR ACL  03/26/2009    Current Medications: Current Meds  Medication Sig  . aspirin EC 81 MG tablet Take 81 mg by mouth daily.  Marland Kitchen atorvastatin (LIPITOR) 40 MG tablet TAKE 1 TABLET(40 MG) BY MOUTH DAILY  . finasteride (PROPECIA) 1 MG tablet  TAKE 1 TABLET(1 MG) BY MOUTH DAILY     Allergies:   Patient has no known allergies.   Social History   Socioeconomic History  . Marital status: Married    Spouse name: Not on file  . Number of children: Not on file  . Years of education: Not on file  . Highest education level: Not on file  Occupational History  . Not on file  Tobacco Use  . Smoking status: Never Smoker  . Smokeless tobacco: Never Used  Substance and Sexual Activity  . Alcohol use: Not Currently    Comment: NO  . Drug use: Not on file  . Sexual activity: Not on file  Other Topics Concern  . Not on file  Social History Narrative  . Not on file   Social Determinants of Health   Financial Resource Strain:   . Difficulty of Paying Living Expenses: Not on file  Food Insecurity:   . Worried About Charity fundraiser in the Last Year: Not on file  . Ran Out of Food in the Last Year: Not on file  Transportation Needs:   . Lack of Transportation (Medical): Not on file  . Lack of Transportation (Non-Medical): Not on file  Physical Activity:   . Days of Exercise per Week: Not on file  . Minutes of Exercise per Session: Not on  file  Stress:   . Feeling of Stress : Not on file  Social Connections:   . Frequency of Communication with Friends and Family: Not on file  . Frequency of Social Gatherings with Friends and Family: Not on file  . Attends Religious Services: Not on file  . Active Member of Clubs or Organizations: Not on file  . Attends Archivist Meetings: Not on file  . Marital Status: Not on file     Family History: The patient's family history includes Heart attack in his father; Kidney failure in his father; Stroke in his father.  ROS:   Please see the history of present illness.     All other systems reviewed and are negative.  EKGs/Labs/Other Studies Reviewed:    The following studies were reviewed today:   EKG:  EKG is  ordered today.  The ekg ordered today demonstrates normal  sinus rhythm, rate 86, no ST/T abnormalities  Recent Labs: 04/21/2019: ALT 24; BUN 18; Creat 1.12; Hemoglobin 15.2; Platelets 198; Potassium 4.3; Sodium 142  Recent Lipid Panel    Component Value Date/Time   CHOL 122 04/21/2019 0903   TRIG 57 04/21/2019 0903   HDL 39 (L) 04/21/2019 0903   CHOLHDL 3.1 04/21/2019 0903   LDLCALC 70 04/21/2019 0903    Physical Exam:    VS:  BP (!) 144/85   Pulse 86   Temp (!) 95.9 F (35.5 C)   Ht 6' (1.829 m)   Wt 192 lb 9.6 oz (87.4 kg)   SpO2 100%   BMI 26.12 kg/m     Wt Readings from Last 3 Encounters:  04/27/19 192 lb 9.6 oz (87.4 kg)  04/23/19 196 lb (88.9 kg)  04/07/18 194 lb (88 kg)     GEN:  Well nourished, well developed in no acute distress HEENT: Normal NECK: No JVD LYMPHATICS: No lymphadenopathy CARDIAC: RRR, no murmurs, rubs, gallops RESPIRATORY:  Clear to auscultation without rales, wheezing or rhonchi  ABDOMEN: Soft, non-tender, non-distended MUSCULOSKELETAL:  No edema; No deformity  SKIN: Warm and dry NEUROLOGIC:  Alert and oriented x 3 PSYCHIATRIC:  Normal affect   ASSESSMENT:    1. Family history of coronary artery disease   2. Hyperlipidemia, unspecified hyperlipidemia type    PLAN:    In order of problems listed above:  Cardiovascular risk assessment: 10-year ASCVD risk score is 6.4%.  On atorvastatin 40 mg, LDL is well controlled.  Does have significant family history of CAD.  Will check calcium score to guide how aggressive to be in cholesterol management  Hyperlipidemia: On atorvastatin 40 mg daily.  LDL 70 on 04/21/2019  RTC in 1 year  Medication Adjustments/Labs and Tests Ordered: Current medicines are reviewed at length with the patient today.  Concerns regarding medicines are outlined above.  Orders Placed This Encounter  Procedures  . CT CARDIAC SCORING  . EKG 12-Lead   No orders of the defined types were placed in this encounter.   Patient Instructions  Medication Instructions:  Decrease  Aspirin to 81 mg daily  *If you need a refill on your cardiac medications before your next appointment, please call your pharmacy*  Lab Work: NONE  Testing/Procedures: Dr. Gardiner Rhyme has ordered a CT coronary calcium score. This test is done at [address]. This is $150 out of pocket.   Coronary CalciumScan A coronary calcium scan is an imaging test used to look for deposits of calcium and other fatty materials (plaques) in the inner  lining of the blood vessels of the heart (coronary arteries). These deposits of calcium and plaques can partly clog and narrow the coronary arteries without producing any symptoms or warning signs. This puts a person at risk for a heart attack. This test can detect these deposits before symptoms develop. Tell a health care provider about:  Any allergies you have.  All medicines you are taking, including vitamins, herbs, eye drops, creams, and over-the-counter medicines.  Any problems you or family members have had with anesthetic medicines.  Any blood disorders you have.  Any surgeries you have had.  Any medical conditions you have.  Whether you are pregnant or may be pregnant. What are the risks? Generally, this is a safe procedure. However, problems may occur, including:  Harm to a pregnant woman and her unborn baby. This test involves the use of radiation. Radiation exposure can be dangerous to a pregnant woman and her unborn baby. If you are pregnant, you generally should not have this procedure done.  Slight increase in the risk of cancer. This is because of the radiation involved in the test. What happens before the procedure? No preparation is needed for this procedure. What happens during the procedure?  You will undress and remove any jewelry around your neck or chest.  You will put on a hospital gown.  Sticky electrodes will be placed on your chest. The electrodes will be connected to an electrocardiogram (ECG)  machine to record a tracing of the electrical activity of your heart.  A CT scanner will take pictures of your heart. During this time, you will be asked to lie still and hold your breath for 2-3 seconds while a picture of your heart is being taken. The procedure may vary among health care providers and hospitals. What happens after the procedure?  You can get dressed.  You can return to your normal activities.  It is up to you to get the results of your test. Ask your health care provider, or the department that is doing the test, when your results will be ready. Summary  A coronary calcium scan is an imaging test used to look for deposits of calcium and other fatty materials (plaques) in the inner lining of the blood vessels of the heart (coronary arteries).  Generally, this is a safe procedure. Tell your health care provider if you are pregnant or may be pregnant.  No preparation is needed for this procedure.  A CT scanner will take pictures of your heart.  You can return to your normal activities after the scan is done. This information is not intended to replace advice given to you by your health care provider. Make sure you discuss any questions you have with your health care provider. Document Released: 09/08/2007 Document Revised: 01/30/2016 Document Reviewed: 01/30/2016 Elsevier Interactive Patient Education  2017 [place]: At [facility], you and your health needs are our priority.  As part of our continuing mission to provide you with exceptional heart care, we have created designated Provider Care Teams.  These Care Teams include your primary Cardiologist (physician) and Advanced Practice Providers (APPs -  Physician Assistants and Nurse Practitioners) who all work together to provide you with the care you need, when you need it.  Your next appointment:   12 month(s)  The format for your next appointment:   In Person  Provider:   Oswaldo Milian, MD       Signed, Donato Heinz, MD  04/27/2019 6:16 PM  [facility]

## 2019-04-27 ENCOUNTER — Other Ambulatory Visit: Payer: Self-pay

## 2019-04-27 ENCOUNTER — Ambulatory Visit: Payer: 59 | Admitting: Cardiology

## 2019-04-27 VITALS — BP 144/85 | HR 86 | Temp 95.9°F | Ht 72.0 in | Wt 192.6 lb

## 2019-04-27 DIAGNOSIS — Z8249 Family history of ischemic heart disease and other diseases of the circulatory system: Secondary | ICD-10-CM | POA: Diagnosis not present

## 2019-04-27 DIAGNOSIS — E785 Hyperlipidemia, unspecified: Secondary | ICD-10-CM

## 2019-04-27 NOTE — Patient Instructions (Signed)
Medication Instructions:  Decrease Aspirin to 81 mg daily  *If you need a refill on your cardiac medications before your next appointment, please call your pharmacy*  Lab Work: NONE  Testing/Procedures: Dr. Gardiner Rhyme has ordered a CT coronary calcium score. This test is done at [address]. This is $150 out of pocket.   Coronary CalciumScan A coronary calcium scan is an imaging test used to look for deposits of calcium and other fatty materials (plaques) in the inner lining of the blood vessels of the heart (coronary arteries). These deposits of calcium and plaques can partly clog and narrow the coronary arteries without producing any symptoms or warning signs. This puts a person at risk for a heart attack. This test can detect these deposits before symptoms develop. Tell a health care provider about:  Any allergies you have.  All medicines you are taking, including vitamins, herbs, eye drops, creams, and over-the-counter medicines.  Any problems you or family members have had with anesthetic medicines.  Any blood disorders you have.  Any surgeries you have had.  Any medical conditions you have.  Whether you are pregnant or may be pregnant. What are the risks? Generally, this is a safe procedure. However, problems may occur, including:  Harm to a pregnant woman and her unborn baby. This test involves the use of radiation. Radiation exposure can be dangerous to a pregnant woman and her unborn baby. If you are pregnant, you generally should not have this procedure done.  Slight increase in the risk of cancer. This is because of the radiation involved in the test. What happens before the procedure? No preparation is needed for this procedure. What happens during the procedure?  You will undress and remove any jewelry around your neck or chest.  You will put on a hospital gown.  Sticky electrodes will be placed on your chest. The electrodes will be connected  to an electrocardiogram (ECG) machine to record a tracing of the electrical activity of your heart.  A CT scanner will take pictures of your heart. During this time, you will be asked to lie still and hold your breath for 2-3 seconds while a picture of your heart is being taken. The procedure may vary among health care providers and hospitals. What happens after the procedure?  You can get dressed.  You can return to your normal activities.  It is up to you to get the results of your test. Ask your health care provider, or the department that is doing the test, when your results will be ready. Summary  A coronary calcium scan is an imaging test used to look for deposits of calcium and other fatty materials (plaques) in the inner lining of the blood vessels of the heart (coronary arteries).  Generally, this is a safe procedure. Tell your health care provider if you are pregnant or may be pregnant.  No preparation is needed for this procedure.  A CT scanner will take pictures of your heart.  You can return to your normal activities after the scan is done. This information is not intended to replace advice given to you by your health care provider. Make sure you discuss any questions you have with your health care provider. Document Released: 09/08/2007 Document Revised: 01/30/2016 Document Reviewed: 01/30/2016 Elsevier Interactive Patient Education  2017 [place]: At [facility], you and your health needs are our priority.  As part of our continuing mission to provide you with exceptional heart care,  we have created designated Provider Care Teams.  These Care Teams include your primary Cardiologist (physician) and Advanced Practice Providers (APPs -  Physician Assistants and Nurse Practitioners) who all work together to provide you with the care you need, when you need it.  Your next appointment:   12 month(s)  The format for your next appointment:   In  Person  Provider:   Oswaldo Milian, MD

## 2019-05-14 ENCOUNTER — Inpatient Hospital Stay: Admission: RE | Admit: 2019-05-14 | Payer: 59 | Source: Ambulatory Visit

## 2019-05-28 ENCOUNTER — Ambulatory Visit (INDEPENDENT_AMBULATORY_CARE_PROVIDER_SITE_OTHER)
Admission: RE | Admit: 2019-05-28 | Discharge: 2019-05-28 | Disposition: A | Discharge status: Home / Self Care | Payer: Self-pay | Source: Ambulatory Visit | Attending: Cardiology | Admitting: Cardiology

## 2019-05-28 ENCOUNTER — Other Ambulatory Visit: Payer: Self-pay

## 2019-05-28 DIAGNOSIS — E785 Hyperlipidemia, unspecified: Secondary | ICD-10-CM

## 2019-05-28 DIAGNOSIS — Z8249 Family history of ischemic heart disease and other diseases of the circulatory system: Secondary | ICD-10-CM

## 2019-05-29 ENCOUNTER — Other Ambulatory Visit: Payer: Self-pay | Admitting: *Deleted

## 2019-05-29 MED ORDER — ATORVASTATIN CALCIUM 80 MG PO TABS: 80.0000 mg | ORAL_TABLET | Freq: Every day | ORAL | 3 refills | Status: DC

## 2019-06-17 ENCOUNTER — Ambulatory Visit: Payer: 59

## 2019-07-16 ENCOUNTER — Other Ambulatory Visit: Payer: Self-pay

## 2019-07-16 MED ORDER — FINASTERIDE 1 MG PO TABS: ORAL_TABLET | ORAL | 5 refills | Status: DC

## 2019-07-16 NOTE — Telephone Encounter (Signed)
Had CPE on 04/23/19, last refill 01/19/19

## 2019-11-26 ENCOUNTER — Ambulatory Visit (INDEPENDENT_AMBULATORY_CARE_PROVIDER_SITE_OTHER): Payer: 59 | Admitting: Physician Assistant

## 2019-11-26 ENCOUNTER — Encounter: Payer: Self-pay | Admitting: Physician Assistant

## 2019-11-26 ENCOUNTER — Other Ambulatory Visit: Payer: Self-pay

## 2019-11-26 DIAGNOSIS — Z85828 Personal history of other malignant neoplasm of skin: Secondary | ICD-10-CM | POA: Diagnosis not present

## 2019-11-26 DIAGNOSIS — D1800 Hemangioma unspecified site: Secondary | ICD-10-CM

## 2019-11-26 DIAGNOSIS — C44519 Basal cell carcinoma of skin of other part of trunk: Secondary | ICD-10-CM | POA: Diagnosis not present

## 2019-11-26 DIAGNOSIS — L821 Other seborrheic keratosis: Secondary | ICD-10-CM

## 2019-11-26 DIAGNOSIS — L814 Other melanin hyperpigmentation: Secondary | ICD-10-CM

## 2019-11-26 DIAGNOSIS — D485 Neoplasm of uncertain behavior of skin: Secondary | ICD-10-CM

## 2019-11-26 DIAGNOSIS — L578 Other skin changes due to chronic exposure to nonionizing radiation: Secondary | ICD-10-CM

## 2019-11-26 DIAGNOSIS — Z1283 Encounter for screening for malignant neoplasm of skin: Secondary | ICD-10-CM | POA: Diagnosis not present

## 2019-11-26 DIAGNOSIS — C4491 Basal cell carcinoma of skin, unspecified: Secondary | ICD-10-CM

## 2019-11-26 HISTORY — DX: Basal cell carcinoma of skin, unspecified: C44.91

## 2019-11-26 NOTE — Patient Instructions (Signed)

## 2019-11-27 ENCOUNTER — Ambulatory Visit: Payer: 59 | Admitting: Physician Assistant

## 2019-12-03 NOTE — Progress Notes (Addendum)
Follow-Up Visit   Subjective  Steve Huynh is a 59 y.o. male who presents for the following: Annual Exam (left forehead, left chest & right cheek - pink & scaly).   The following portions of the chart were reviewed this encounter and updated as appropriate: Tobacco  Allergies  Meds  Problems  Med Hx  Surg Hx  Fam Hx      Objective  Well appearing patient in no apparent distress; mood and affect are within normal limits.  A full examination was performed including scalp, head, eyes, ears, nose, lips, neck, chest, axillae, abdomen, back, buttocks, bilateral upper extremities, bilateral lower extremities, hands, feet, fingers, toes, fingernails, and toenails. All findings within normal limits unless otherwise noted below.  Objective  Head - to toe: No atypical nevi   Objective  Left Forehead: Hyperkeratotic scale with pink base  Txpbx 0.5cm     Objective  Mid Parietal Scalp: All scars clear  Objective  Chest - Medial [facility]): Scars clear  Objective  Left outer chest: Ulcerated red lesion Txpbx-1.2cm     Assessment & Plan  Screening exam for skin cancer Head - to toe  Skin examinations  Neoplasm of uncertain behavior of skin Left Forehead  Skin / nail biopsy Type of biopsy: tangential   Informed consent: discussed and consent obtained   Timeout: patient name, date of birth, surgical site, and procedure verified   Anesthesia: the lesion was anesthetized in a standard fashion   Anesthetic:  1% lidocaine w/ epinephrine 1-100,000 local infiltration Instrument used: flexible razor blade   Hemostasis achieved with: aluminum chloride and electrodesiccation   Outcome: patient tolerated procedure well   Post-procedure details: wound care instructions given    Destruction of lesion Complexity: simple   Destruction method: electrodesiccation and curettage   Informed consent: discussed and consent obtained   Timeout:  patient name, date of birth,  surgical site, and procedure verified Anesthesia: the lesion was anesthetized in a standard fashion   Anesthetic:  1% lidocaine w/ epinephrine 1-100,000 local infiltration Curettage performed in three different directions: Yes   Electrodesiccation performed over the curetted area: Yes   Curettage cycles:  3 Lesion length (cm):  0.5 Lesion width (cm):  0.5 Margin per side (cm):  0.1 Final wound size (cm):  0.7 Hemostasis achieved with:  aluminum chloride Outcome: patient tolerated procedure well with no complications   Post-procedure details: wound care instructions given    Specimen 1 - Surgical pathology Differential Diagnosis: bcc Check Margins: yes  History of basal cell carcinoma (BCC) Mid Parietal Scalp  observe  History of SCC (squamous cell carcinoma) of skin Chest - Medial (Center)  observe  BCC (basal cell carcinoma), chest Left outer chest  Skin / nail biopsy Type of biopsy: tangential   Informed consent: discussed and consent obtained   Timeout: patient name, date of birth, surgical site, and procedure verified   Anesthesia: the lesion was anesthetized in a standard fashion   Anesthetic:  1% lidocaine w/ epinephrine 1-100,000 local infiltration Instrument used: flexible razor blade   Hemostasis achieved with: aluminum chloride and electrodesiccation   Outcome: patient tolerated procedure well   Post-procedure details: wound care instructions given    Destruction of lesion Complexity: simple   Destruction method: electrodesiccation and curettage   Informed consent: discussed and consent obtained   Timeout:  patient name, date of birth, surgical site, and procedure verified Anesthesia: the lesion was anesthetized in a standard fashion   Anesthetic:  1% lidocaine w/ epinephrine  1-100,000 local infiltration Curettage performed in three different directions: Yes   Electrodesiccation performed over the curetted area: Yes   Curettage cycles:  3 Margin per side  (cm):  0.1 Final wound size (cm):  1.2 Hemostasis achieved with:  aluminum chloride Outcome: patient tolerated procedure well with no complications   Post-procedure details: wound care instructions given    Specimen 2 - Surgical pathology Differential Diagnosis: scc Check Margins:yes  Lentigines - Scattered tan macules - Discussed due to sun exposure - Benign, observe - Call for any changes  Seborrheic Keratoses - Stuck-on, waxy, tan-brown papules and plaques  - Discussed benign etiology and prognosis. - Observe - Call for any changes  Hemangiomas - Red papules - Discussed benign nature - Observe - Call for any changes  Actinic Damage - diffuse scaly erythematous macules with underlying dyspigmentation - Recommend daily broad spectrum sunscreen SPF 30+ to sun-exposed areas, reapply every 2 hours as needed.  - Call for new or changing lesions.   I, Verlan Grotz, PA-C, have reviewed all documentation's for this visit.  The documentation on 12/03/19 for the exam, diagnosis, procedures and orders are all accurate and complete.

## 2019-12-07 ENCOUNTER — Encounter: Payer: Self-pay | Admitting: *Deleted

## 2019-12-07 ENCOUNTER — Telehealth: Payer: Self-pay | Admitting: *Deleted

## 2019-12-07 NOTE — Telephone Encounter (Signed)
Told patient to return our call if any quesitons

## 2019-12-07 NOTE — Telephone Encounter (Signed)
-----   Message from Warren Danes, [place] sent at 12/03/2019 12:07 PM EDT ----- Chest bcc. RTC if recurs

## 2019-12-29 ENCOUNTER — Ambulatory Visit: Payer: 59

## 2020-01-05 ENCOUNTER — Encounter: Payer: Self-pay | Admitting: Internal Medicine

## 2020-01-05 ENCOUNTER — Other Ambulatory Visit: Payer: Self-pay

## 2020-01-05 ENCOUNTER — Ambulatory Visit (INDEPENDENT_AMBULATORY_CARE_PROVIDER_SITE_OTHER): Payer: 59 | Admitting: *Deleted

## 2020-01-05 ENCOUNTER — Ambulatory Visit: Payer: 59 | Admitting: Internal Medicine

## 2020-01-05 ENCOUNTER — Telehealth: Payer: Self-pay | Admitting: Internal Medicine

## 2020-01-05 VITALS — BP 130/80 | HR 88 | Temp 98.6°F | Wt 192.0 lb

## 2020-01-05 DIAGNOSIS — L570 Actinic keratosis: Secondary | ICD-10-CM

## 2020-01-05 DIAGNOSIS — H1031 Unspecified acute conjunctivitis, right eye: Secondary | ICD-10-CM

## 2020-01-05 DIAGNOSIS — Z8639 Personal history of other endocrine, nutritional and metabolic disease: Secondary | ICD-10-CM

## 2020-01-05 DIAGNOSIS — L659 Nonscarring hair loss, unspecified: Secondary | ICD-10-CM

## 2020-01-05 MED ORDER — AMINOLEVULINIC ACID HCL 10 % EX GEL
2000.0000 mg | Freq: Once | CUTANEOUS | Status: AC
  Administered 2020-01-05: 2000 mg via TOPICAL

## 2020-01-05 MED ORDER — FINASTERIDE 1 MG PO TABS: 1.0000 mg | ORAL_TABLET | Freq: Every day | ORAL | 3 refills | Status: DC

## 2020-01-05 MED ORDER — OFLOXACIN 0.3 % OP SOLN: 2.0000 [drp] | Freq: Four times a day (QID) | OPHTHALMIC | 1 refills | Status: DC

## 2020-01-05 NOTE — Telephone Encounter (Signed)
OK 

## 2020-01-05 NOTE — Progress Notes (Signed)
   Subjective:    Patient ID: Steve Huynh, male    DOB: 06-May-1960, 59 y.o.   MRN: [id number]  HPI Steve Huynh has a swollen right upper eye lid and eye irritation  that has been present for several days. No significant drainage from eye. Did go to gym to work out late last week. May have been exposed to conjunctivitis there.  No visual disturbance of right eye.   History of hyperlipidemia treated with  daily.  Lipid panel in January of this year was normal with the exception of slightly low HDL of 39.  Seen in February of this year by Dr. Alda Lea, Cardiologist.  CT calcium score in March of this year was 233.  At that time generic Lipitor was increased to 80 mg daily.  History of alopecia treated with Propecia 1 mg daily.  This was refilled for 1 year.  Review of Systems- no fever or chills. No sore throat.      Objective:   Physical Exam  Right Eye: upper lid is red and swollen. Conjuctiva right upper eye lid is very red; however, lower conjunctiva less red. No drainage in corner of eye. EOM's full. PERLA      Assessment & Plan:  Acute bacterial conjunctivitis right eye-advised warm hot compresses to right eye 20 minutes 3 times a day.  Ocuflox ophthalmic drops 2 drops in right eye 4 times a day for 5 to 7 days.  Wash linens and towels daily until conjunctivitis resolves.  Do not rub eyes.  Wash hands frequently.  History of hyperlipidemia-make sure Lipitor 80 mg generic refilled for 90 days at a time for 1 year.  Alopecia- Propecia 1 mg daily is refilled for 1 year.

## 2020-01-05 NOTE — Telephone Encounter (Signed)
Scheduled

## 2020-01-05 NOTE — Patient Instructions (Signed)

## 2020-01-05 NOTE — Telephone Encounter (Signed)
Steve Huynh [id number]  [facility] called to say since last Thursday he has had a swollen eye lid, it is red and sore tender to touch. He would like to come in today for you to look at it.

## 2020-01-05 NOTE — Progress Notes (Signed)
Patient here for PDT on face- cleansed face with acetone and applied Levulan- Levulan will incubate for 120 minutes and then sit under the blue light for 16 minutes- 82 seconds. Patient will cleanse face and apply sunscreen and follow up in 3 months

## 2020-01-05 NOTE — Patient Instructions (Signed)
It was a pleasure to see you today.  Use Ocuflox ophthalmic drops in right eye 2 drops 4 times a day for 5 to 7 days.  Warm compresses to right eye lid 20 minutes 3 times a day for 2 or 3 days.  Call if not improving in 48 to 72 hours or sooner if worse.  Generic Propecia has been refilled for 1 year as has generic Lipitor.

## 2020-04-26 ENCOUNTER — Encounter: Payer: Self-pay | Admitting: Physician Assistant

## 2020-04-26 ENCOUNTER — Other Ambulatory Visit: Payer: Self-pay

## 2020-04-26 ENCOUNTER — Ambulatory Visit: Payer: BC Managed Care – PPO | Admitting: Physician Assistant

## 2020-04-26 DIAGNOSIS — Z85828 Personal history of other malignant neoplasm of skin: Secondary | ICD-10-CM

## 2020-04-26 DIAGNOSIS — L820 Inflamed seborrheic keratosis: Secondary | ICD-10-CM | POA: Diagnosis not present

## 2020-04-26 DIAGNOSIS — L821 Other seborrheic keratosis: Secondary | ICD-10-CM

## 2020-04-26 DIAGNOSIS — Z1283 Encounter for screening for malignant neoplasm of skin: Secondary | ICD-10-CM

## 2020-04-26 DIAGNOSIS — L570 Actinic keratosis: Secondary | ICD-10-CM | POA: Diagnosis not present

## 2020-04-26 MED ORDER — TOLAK 4 % EX CREA: 1.0000 | TOPICAL_CREAM | Freq: Every evening | CUTANEOUS | 1 refills | Status: DC

## 2020-05-04 ENCOUNTER — Encounter: Payer: Self-pay | Admitting: Physician Assistant

## 2020-05-04 NOTE — Progress Notes (Addendum)
   Follow-Up Visit   Subjective  Steve Huynh is a 60 y.o. male who presents for the following: Follow-up (PDT FOLLOW UP GOOD REACTION, NO SCALE LEFT OVER FROM TREATMENT).   The following portions of the chart were reviewed this encounter and updated as appropriate:  Tobacco  Allergies  Meds  Problems  Med Hx  Surg Hx  Fam Hx      Objective  Well appearing patient in no apparent distress; mood and affect are within normal limits.  A full examination was performed including scalp, head, eyes, ears, nose, lips, neck, chest, axillae, abdomen, back, buttocks, bilateral upper extremities, bilateral lower extremities, hands, feet, fingers, toes, fingernails, and toenails. All findings within normal limits unless otherwise noted below.  Objective  Head - Anterior (Face): Multiple white scar- clear  Objective  Left Breast: Waist up skin examination- no atypical moles or non mole skin cancer  Objective  Left Forearm - Posterior, Right Forearm - Posterior: Erythematous patches with gritty scale.  Objective  Left Upper Back, Right Breast, Right Upper Back: Multiple stuck-on, waxy, tan-brown papules and plaques. --Discussed benign etiology and prognosis.   Assessment & Plan  History of basal cell cancer Head - Anterior (Face)  Yearly skin check  Encounter for screening for malignant neoplasm of skin Left Breast  Yearly skin check  AK (actinic keratosis) (2) Left Forearm - Posterior; Right Forearm - Posterior  Destruction of lesion - Left Forearm - Posterior, Right Forearm - Posterior Complexity: simple   Destruction method: cryotherapy   Informed consent: discussed and consent obtained   Timeout:  patient name, date of birth, surgical site, and procedure verified Lesion destroyed using liquid nitrogen: Yes   Cryotherapy cycles:  3 Outcome: patient tolerated procedure well with no complications    Fluorouracil (TOLAK) 4 % CREA - Left Forearm - Posterior, Right  Forearm - Posterior  Seborrheic keratosis, inflamed (3) Right Breast; Left Upper Back; Right Upper Back  Destruction of lesion - Left Upper Back, Right Breast, Right Upper Back Complexity: simple   Destruction method: cryotherapy   Informed consent: discussed and consent obtained   Timeout:  patient name, date of birth, surgical site, and procedure verified Lesion destroyed using liquid nitrogen: Yes   Cryotherapy cycles:  3 Outcome: patient tolerated procedure well with no complications     I, Janaa Acero, PA-C, have reviewed all documentation's for this visit.  The documentation on 05/19/20 for the exam, diagnosis, procedures and orders are all accurate and complete.

## 2020-05-06 ENCOUNTER — Other Ambulatory Visit: Payer: Self-pay

## 2020-05-06 ENCOUNTER — Other Ambulatory Visit: Payer: BC Managed Care – PPO | Admitting: Internal Medicine

## 2020-05-06 DIAGNOSIS — E786 Lipoprotein deficiency: Secondary | ICD-10-CM

## 2020-05-06 DIAGNOSIS — Z8639 Personal history of other endocrine, nutritional and metabolic disease: Secondary | ICD-10-CM

## 2020-05-06 DIAGNOSIS — Z125 Encounter for screening for malignant neoplasm of prostate: Secondary | ICD-10-CM

## 2020-05-06 DIAGNOSIS — Z0000 Encounter for general adult medical examination without abnormal findings: Secondary | ICD-10-CM | POA: Diagnosis not present

## 2020-05-06 DIAGNOSIS — R7309 Other abnormal glucose: Secondary | ICD-10-CM | POA: Diagnosis not present

## 2020-05-08 NOTE — Progress Notes (Unsigned)
Cardiology Office Note:    Date:  05/10/2020   ID:  Steve Huynh, DOB [birth year], MRN [id number]  PCP:  Elby Showers, MD  Cardiologist:  No primary care provider on file.  Electrophysiologist:  None   Referring MD: Elby Showers, MD   Chief Complaint  Patient presents with  . Coronary Artery Disease    History of Present Illness:    Steve Huynh is a 60 y.o. male with a hx of basal cell carcinoma, hyperlipidemia, family history of CAD who presents for follow-up.  He was referred by Dr. Renold Genta for cardiovascular risk assessment, initially seen on 04/27/2019.  He reports that he was started on atorvastatin 80 mg daily in 2007 for hyperlipidemia.  States that cholesterol has been well controlled since that time and his dose had been decreased to 40 mg daily.  Father underwent PCI in his early 15s and had a stroke at age 4.    Calcium score on 05/28/2019 was 233 (86 percentile).  Atorvastatin dose was increased to 80 mg daily.  Since last clinic visit, he reports that he is doing well.  Denies any chest pain, dyspnea, lightheadedness, syncope, lower extremity edema, or palpitations.  Goes to gym 3 days per week and does resistance training.  Walks/jogs on treadmill 1-2 times per week for 30 minutes.  Also will go on hikes intermittently for 8 to 10 miles.  Denies any exertional symptoms.  Past Medical History:  Diagnosis Date  . Basal cell carcinoma 11/26/2019   infil & sup- left outer chest- txpbx  . BCC (basal cell carcinoma) 10/30/1989   vertex of scalp  . BCC (basal cell carcinoma) 07/24/1995   vertex  . BCC (basal cell carcinoma) sup 12/27/2016   right shin  . BCC (basal cell carcinoma) sup and micronod 12/27/2016   right anterior scalp  . BCC (basal cell carcinoma) super nod 10/07/2013   mid back  . BCC (basal cell carcinoma) superficial 07/24/1995   Left anterior shoulder  . Hyperlipidemia   . 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 SCC  (squamous cell carcinoma) in situ 05/02/2010   center chest    Past Surgical History:  Procedure Laterality Date  . ARTHROSCOPIC REPAIR ACL  03/26/2009    Current Medications: Current Meds  Medication Sig  . aspirin EC 81 MG tablet Take 81 mg by mouth daily.  Marland Kitchen atorvastatin (LIPITOR) 80 MG tablet Take 1 tablet (80 mg total) by mouth daily at 6 PM.  . ezetimibe (ZETIA) 10 MG tablet Take 1 tablet (10 mg total) by mouth daily.  . finasteride (PROPECIA) 1 MG tablet Take 1 tablet (1 mg total) by mouth daily.  . Fluorouracil (TOLAK) 4 % CREA Apply 1 application topically at bedtime. Apply to affected area qhs Monday-Sunday x 14 days     Allergies:   Patient has no known allergies.   Social History   Socioeconomic History  . Marital status: Married    Spouse name: Not on file  . Number of children: Not on file  . Years of education: Not on file  . Highest education level: Not on file  Occupational History  . Not on file  Tobacco Use  . Smoking status: Never Smoker  . Smokeless tobacco: Never Used  Substance and Sexual Activity  . Alcohol use: Not Currently    Comment: NO  . Drug use: Not on file  . Sexual activity: Not on file  Other Topics  Concern  . Not on file  Social History Narrative  . Not on file   Social Determinants of Health   Financial Resource Strain: Not on file  Food Insecurity: Not on file  Transportation Needs: Not on file  Physical Activity: Not on file  Stress: Not on file  Social Connections: Not on file     Family History: The patient's family history includes Heart attack in his father; Kidney failure in his father; Stroke in his father.  ROS:   Please see the history of present illness.     All other systems reviewed and are negative.  EKGs/Labs/Other Studies Reviewed:    The following studies were reviewed today:   EKG:  EKG is ordered today.  The ekg ordered today demonstrates normal sinus rhythm, rate 73, no ST/T  abnormalities  Recent Labs: 05/06/2020: ALT 37; BUN 16; Creat 0.96; Hemoglobin 16.4; Platelets 204; Potassium 4.6; Sodium 139  Recent Lipid Panel    Component Value Date/Time   CHOL 133 05/06/2020 0942   TRIG 64 05/06/2020 0942   HDL 47 05/06/2020 0942   CHOLHDL 2.8 05/06/2020 0942   LDLCALC 72 05/06/2020 0942    Physical Exam:    VS:  BP 128/74   Pulse 73   Ht 6\' 1"  (1.854 m)   Wt 198 lb (89.8 kg)   SpO2 100%   BMI 26.12 kg/m     Wt Readings from Last 3 Encounters:  05/10/20 198 lb (89.8 kg)  05/10/20 199 lb (90.3 kg)  01/05/20 192 lb (87.1 kg)     GEN:  Well nourished, well developed in no acute distress HEENT: Normal NECK: No JVD CARDIAC: RRR, no murmurs, rubs, gallops RESPIRATORY:  Clear to auscultation without rales, wheezing or rhonchi  ABDOMEN: Soft, non-tender, non-distended MUSCULOSKELETAL:  No edema; No deformity  SKIN: Warm and dry NEUROLOGIC:  Alert and oriented x 3 PSYCHIATRIC:  Normal affect   ASSESSMENT:    1. CAD in native artery   2. Hyperlipidemia, unspecified hyperlipidemia type    PLAN:    CAD: Calcium score on 05/28/2019 was 233 (86 percentile).  LDL 72 on atorvastatin 80 mg daily.  No anginal symptoms. -Continue atorvastatin 80 mg daily, will add Zetia 10 mg daily for goal LDL less than 70  Hyperlipidemia: On atorvastatin 80 mg daily.  LDL 72 on 05/06/2020.  Add Zetia 10 mg daily as above.  Will recheck fasting lipid panel in 2 to 3 months  RTC in 1 year  Medication Adjustments/Labs and Tests Ordered: Current medicines are reviewed at length with the patient today.  Concerns regarding medicines are outlined above.  Orders Placed This Encounter  Procedures  . Lipid panel  . EKG 12-Lead   Meds ordered this encounter  Medications  . ezetimibe (ZETIA) 10 MG tablet    Sig: Take 1 tablet (10 mg total) by mouth daily.    Dispense:  90 tablet    Refill:  3    Patient Instructions  Medication Instructions:  START Zetia 10 mg  daily  *If you need a refill on your cardiac medications before your next appointment, please call your pharmacy*   Lab Work: Please return for FASTING labs in 2-3 months (Lipid)  Our in office lab hours are Monday-Friday 8:00-4:00, closed for lunch 12:45-1:45 pm.  No appointment needed.  Follow-Up: At [facility], you and your health needs are our priority.  As part of our continuing mission to provide you with exceptional heart care, we have created  designated Provider Care Teams.  These Care Teams include your primary Cardiologist (physician) and Advanced Practice Providers (APPs -  Physician Assistants and Nurse Practitioners) who all work together to provide you with the care you need, when you need it.  We recommend signing up for the patient portal called "MyChart".  Sign up information is provided on this After Visit Summary.  MyChart is used to connect with patients for Virtual Visits (Telemedicine).  Patients are able to view lab/test results, encounter notes, upcoming appointments, etc.  Non-urgent messages can be sent to your provider as well.   To learn more about what you can do with MyChart, go to [email or web address].    Your next appointment:   12 month(s)  The format for your next appointment:   In Person  Provider:   Oswaldo Milian, MD       Signed, Donato Heinz, MD  05/10/2020 5:59 PM    [place]

## 2020-05-10 ENCOUNTER — Ambulatory Visit: Payer: BC Managed Care – PPO | Admitting: Cardiology

## 2020-05-10 ENCOUNTER — Other Ambulatory Visit: Payer: Self-pay

## 2020-05-10 ENCOUNTER — Encounter: Payer: Self-pay | Admitting: Cardiology

## 2020-05-10 ENCOUNTER — Ambulatory Visit: Payer: 59 | Admitting: Cardiology

## 2020-05-10 ENCOUNTER — Encounter: Payer: Self-pay | Admitting: Internal Medicine

## 2020-05-10 ENCOUNTER — Ambulatory Visit (INDEPENDENT_AMBULATORY_CARE_PROVIDER_SITE_OTHER): Payer: BC Managed Care – PPO | Admitting: Internal Medicine

## 2020-05-10 VITALS — BP 120/70 | HR 78 | Ht 72.0 in | Wt 199.0 lb

## 2020-05-10 VITALS — BP 128/74 | HR 73 | Ht 73.0 in | Wt 198.0 lb

## 2020-05-10 DIAGNOSIS — I2510 Atherosclerotic heart disease of native coronary artery without angina pectoris: Secondary | ICD-10-CM | POA: Diagnosis not present

## 2020-05-10 DIAGNOSIS — Z8249 Family history of ischemic heart disease and other diseases of the circulatory system: Secondary | ICD-10-CM | POA: Diagnosis not present

## 2020-05-10 DIAGNOSIS — Z0000 Encounter for general adult medical examination without abnormal findings: Secondary | ICD-10-CM

## 2020-05-10 DIAGNOSIS — Z8639 Personal history of other endocrine, nutritional and metabolic disease: Secondary | ICD-10-CM | POA: Diagnosis not present

## 2020-05-10 DIAGNOSIS — E785 Hyperlipidemia, unspecified: Secondary | ICD-10-CM | POA: Diagnosis not present

## 2020-05-10 DIAGNOSIS — R7309 Other abnormal glucose: Secondary | ICD-10-CM

## 2020-05-10 LAB — POCT URINALYSIS DIPSTICK
Appearance: NEGATIVE
Bilirubin, UA: NEGATIVE
Blood, UA: NEGATIVE
Glucose, UA: NEGATIVE
Ketones, UA: NEGATIVE
Leukocytes, UA: NEGATIVE
Nitrite, UA: NEGATIVE
Odor: NEGATIVE
Protein, UA: NEGATIVE
Spec Grav, UA: 1.01 (ref 1.010–1.025)
Urobilinogen, UA: 0.2 E.U./dL
pH, UA: 7 (ref 5.0–8.0)

## 2020-05-10 MED ORDER — EZETIMIBE 10 MG PO TABS: 10.0000 mg | ORAL_TABLET | Freq: Every day | ORAL | 3 refills | Status: DC

## 2020-05-10 NOTE — Patient Instructions (Signed)
Medication Instructions:  START Zetia 10 mg daily  *If you need a refill on your cardiac medications before your next appointment, please call your pharmacy*   Lab Work: Please return for FASTING labs in 2-3 months (Lipid)  Our in office lab hours are Monday-Friday 8:00-4:00, closed for lunch 12:45-1:45 pm.  No appointment needed.  Follow-Up: At [facility], you and your health needs are our priority.  As part of our continuing mission to provide you with exceptional heart care, we have created designated Provider Care Teams.  These Care Teams include your primary Cardiologist (physician) and Advanced Practice Providers (APPs -  Physician Assistants and Nurse Practitioners) who all work together to provide you with the care you need, when you need it.  We recommend signing up for the patient portal called "MyChart".  Sign up information is provided on this After Visit Summary.  MyChart is used to connect with patients for Virtual Visits (Telemedicine).  Patients are able to view lab/test results, encounter notes, upcoming appointments, etc.  Non-urgent messages can be sent to your provider as well.   To learn more about what you can do with MyChart, go to [email or web address].    Your next appointment:   12 month(s)  The format for your next appointment:   In Person  Provider:   Oswaldo Milian, MD

## 2020-05-10 NOTE — Progress Notes (Signed)
   Subjective:    Patient ID: Steve Huynh, male    DOB: [birth year], 60 y.o.   MRN: [id number]  HPI  60 year old Male for health maintenance exam.  Everyone in the family has had Covid 28 except him. Says they had mild symptoms for about 3 days.  No complaints or new issues. Fasting serum glucose slightly elevated and Hgb AIC will be added to labs.  Has had flu vaccine and tetanus immunization is up to date. Has had 3 Covid-19 vaccines. Tetanus immunization is up to date.  PSA is normal and no symptoms of prostatism.  He exercises regularly and watches his weight.   Has been taking Lipitor for a number of years since around 2007. He takes Propecia.  Family history: Father underwent angioplasty in his early 37s and had a stroke at age 77.  Patient was referred to cardiology in 2021 had a calcium score of 233 and at that time generic Lipitor was increased to 80 mg daily. He is due to see Cardiologist later today.  Social history: He is married. 1 daughter and 2 sons. Social alcohol consumption typically at social events only. Non-smoker. Enjoys skiing. 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  He had ACL repair from skiing accident some 10 years or so ago.  There is a history of basal cell carcinoma of skin followed by dermatology on a regular basis.  History of influenza January 2020.  He had an Executive physical at [facility] in 2020 and a chest x-ray done there showed a possible left upper lobe nodule and subsequently dual energy chest radiography evaluated as showing no upper lobe nodule at all. EKG was normal during that exam.  Family history: Father died at age 89 renal failure with history of coronary artery disease. Father had angioplasty at 68 and stroke at 79. Mother living in good health. 3 brothers, an older brother who is in good health and twin brothers age 86 in good health.  He had a colonoscopy around age 44 by Dr.  Watt Climes with 10-year follow-up recommended.  Tetanus immunization is up-to-date. He has had 3 COVID-19 immunizations. Had flu vaccine in November 2021.  Review of Systems feels well with no new complaints     Objective:   Physical Exam Blood pressure excellent 120/70 pulse 78 pulse oximetry 98% weight 199 pounds BMI 26.99  Skin is warm and dry with no abnormal lesions noted. Followed by Dr. Denna Haggard. TMs are clear. Neck is supple without JVD thyromegaly or carotid bruits. Chest is clear to auscultation. Cardiac exam: Regular rate and rhythm normal S1 and S2 without murmurs or gallops. Abdomen is soft nondistended without hepatosplenomegaly masses or tenderness. Rectal exam: Prostate normal without nodules. Extremities without edema. No deformity. Neuro: No focal deficits on brief neurological exam. Affect thought and judgment are entirely normal.       Assessment & Plan:  Normal health maintenance exam-colonoscopy up-to-date and immunizations up-to-date. PSA is normal.  History of hyperlipidemia currently on Lipitor 80 mg daily and followed by cardiology due to family history. He will be seeing Cardiologist later today. Lipid panel drawn on February 11 is entirely normal. His HDL has increased from 39-47.  Plan: Return in 1 year or as needed. Continue Lipitor.

## 2020-05-10 NOTE — Patient Instructions (Signed)
He is seeing Cardiology today. Continue current meds. He is in excellent physical shape and works out regularly. RTC in one year or as needed. PSA is normal.

## 2020-05-11 LAB — TEST AUTHORIZATION

## 2020-05-11 LAB — CBC WITH DIFFERENTIAL/PLATELET
Absolute Monocytes: 554 cells/uL (ref 200–950)
Basophils Absolute: 18 cells/uL (ref 0–200)
Basophils Relative: 0.4 %
Eosinophils Absolute: 117 cells/uL (ref 15–500)
Eosinophils Relative: 2.6 %
HCT: 48.6 % (ref 38.5–50.0)
Hemoglobin: 16.4 g/dL (ref 13.2–17.1)
Lymphs Abs: 1665 cells/uL (ref 850–3900)
MCH: 30.3 pg (ref 27.0–33.0)
MCHC: 33.7 g/dL (ref 32.0–36.0)
MCV: 89.7 fL (ref 80.0–100.0)
MPV: 10.9 fL (ref 7.5–12.5)
Monocytes Relative: 12.3 %
Neutro Abs: 2147 cells/uL (ref 1500–7800)
Neutrophils Relative %: 47.7 %
Platelets: 204 10*3/uL (ref 140–400)
RBC: 5.42 10*6/uL (ref 4.20–5.80)
RDW: 12.4 % (ref 11.0–15.0)
Total Lymphocyte: 37 %
WBC: 4.5 10*3/uL (ref 3.8–10.8)

## 2020-05-11 LAB — COMPLETE METABOLIC PANEL WITH GFR
AG Ratio: 2.3 (calc) (ref 1.0–2.5)
ALT: 37 U/L (ref 9–46)
AST: 29 U/L (ref 10–35)
Albumin: 4.8 g/dL (ref 3.6–5.1)
Alkaline phosphatase (APISO): 66 U/L (ref 35–144)
BUN: 16 mg/dL (ref 7–25)
CO2: 32 mmol/L (ref 20–32)
Calcium: 9.9 mg/dL (ref 8.6–10.3)
Chloride: 102 mmol/L (ref 98–110)
Creat: 0.96 mg/dL (ref 0.70–1.33)
GFR, Est African American: 101 mL/min/{1.73_m2} (ref >=60)
GFR, Est Non African American: 87 mL/min/{1.73_m2} (ref >=60)
Globulin: 2.1 g/dL (calc) (ref 1.9–3.7)
Glucose, Bld: 105 mg/dL — ABNORMAL HIGH (ref 65–99)
Potassium: 4.6 mmol/L (ref 3.5–5.3)
Sodium: 139 mmol/L (ref 135–146)
Total Bilirubin: 1 mg/dL (ref 0.2–1.2)
Total Protein: 6.9 g/dL (ref 6.1–8.1)

## 2020-05-11 LAB — LIPID PANEL
Cholesterol: 133 mg/dL (ref <200)
HDL: 47 mg/dL (ref >=40)
LDL Cholesterol (Calc): 72 mg/dL (calc)
Non-HDL Cholesterol (Calc): 86 mg/dL (calc) (ref <130)
Total CHOL/HDL Ratio: 2.8 (calc) (ref <5.0)
Triglycerides: 64 mg/dL (ref <150)

## 2020-05-11 LAB — HEMOGLOBIN A1C W/OUT EAG: Hgb A1c MFr Bld: 5.3 % of total Hgb (ref <5.7)

## 2020-05-11 LAB — PSA: PSA: 0.16 ng/mL (ref <=4.0)

## 2020-05-14 ENCOUNTER — Other Ambulatory Visit: Payer: Self-pay | Admitting: Cardiology

## 2020-09-09 ENCOUNTER — Encounter: Payer: Self-pay | Admitting: Internal Medicine

## 2020-09-09 ENCOUNTER — Other Ambulatory Visit: Payer: Self-pay

## 2020-09-09 ENCOUNTER — Ambulatory Visit (INDEPENDENT_AMBULATORY_CARE_PROVIDER_SITE_OTHER): Payer: BC Managed Care – PPO | Admitting: Internal Medicine

## 2020-09-09 ENCOUNTER — Telehealth: Payer: Self-pay

## 2020-09-09 VITALS — HR 125 | Temp 99.2°F

## 2020-09-09 DIAGNOSIS — U071 COVID-19: Secondary | ICD-10-CM | POA: Diagnosis not present

## 2020-09-09 MED ORDER — AZITHROMYCIN 250 MG PO TABS: ORAL_TABLET | ORAL | 0 refills | Status: AC

## 2020-09-09 MED ORDER — HYDROCODONE BIT-HOMATROP MBR 5-1.5 MG/5ML PO SOLN: 5.0000 mL | Freq: Three times a day (TID) | ORAL | 0 refills | Status: DC | PRN

## 2020-09-09 NOTE — Telephone Encounter (Signed)
Car Visit scheduled

## 2020-09-09 NOTE — Telephone Encounter (Signed)
Tested positive for COVID on 06/14 he has a cough that is turning into a respiratory infection. Temp is 100.3. He would like something called in.  [phone]

## 2020-09-10 NOTE — Patient Instructions (Signed)
Take Zithromax Z-PAK 2 tabs day 1 followed by 1 tab days 2 through 5.  Rest and drink plenty of fluids.  May take Hycodan sparingly 1 teaspoon every 8 hours as needed for cough.  Quarantine for 5 days and call if symptoms worsen.  Monitor pulse oximetry.

## 2020-09-10 NOTE — Progress Notes (Signed)
   Subjective:    Patient ID: Steve Huynh, male    DOB: [birth year], 60 y.o.   MRN: [id number]  HPI 60 year old Male seen today for cough with positive home Covid test on June 14. Apparently accompanied his mother to ED earlier this week for several hours and likely had exposure there despite wearing a mask.Has had Temp 100.3 degrees and has developed a cough. Our records show he has received 3 Covid-19 vaccines.  Takes generic Lipitor  and Zetia for Family hx of heart disease and hyperlipidemia.General health is excellent.    Review of Systems malaise and fatigue is fairly mild .Has developed a cough but no SOB     Objective:   Physical Exam T 99.2 degrees pulse ox 97% pulse 125, RR is normal  TMs are slightly full but not reviewed.  Pharynx very slightly injected.  Chest clear to auscultation without rales or wheezing.       Assessment & Plan:  COVID-19 virus infection  Plan: We discussed treatment options including Paxlovid. He prefers an antibiotic and something for cough.  I have sent in Zithromax Z-Pak 2 tabs day 1 followed by 1 tab days 2 through 5.  Hycodan 1 teaspoon every 8 hours as needed for cough.  Rest and drink fluids.  Quarantine for 5 days.  Call if symptoms worsen.  Monitor pulse oximetry.

## 2020-09-12 ENCOUNTER — Telehealth: Payer: Self-pay | Admitting: Internal Medicine

## 2020-09-12 NOTE — Telephone Encounter (Signed)
[place] results to [facility] [phone], phone [phone]

## 2020-10-13 DIAGNOSIS — I2510 Atherosclerotic heart disease of native coronary artery without angina pectoris: Secondary | ICD-10-CM | POA: Diagnosis not present

## 2020-10-13 DIAGNOSIS — E785 Hyperlipidemia, unspecified: Secondary | ICD-10-CM | POA: Diagnosis not present

## 2020-10-13 LAB — LIPID PANEL
Chol/HDL Ratio: 2.5 ratio (ref 0.0–5.0)
Cholesterol, Total: 106 mg/dL (ref 100–199)
HDL: 43 mg/dL (ref >39)
LDL Chol Calc (NIH): 51 mg/dL (ref 0–99)
Triglycerides: 51 mg/dL (ref 0–149)
VLDL Cholesterol Cal: 12 mg/dL (ref 5–40)

## 2020-12-30 ENCOUNTER — Other Ambulatory Visit: Payer: Self-pay | Admitting: Internal Medicine

## 2021-02-08 ENCOUNTER — Encounter: Payer: Self-pay | Admitting: Physician Assistant

## 2021-02-08 ENCOUNTER — Ambulatory Visit: Payer: BC Managed Care – PPO | Admitting: Physician Assistant

## 2021-02-08 ENCOUNTER — Other Ambulatory Visit: Payer: Self-pay

## 2021-02-08 DIAGNOSIS — D485 Neoplasm of uncertain behavior of skin: Secondary | ICD-10-CM

## 2021-02-08 DIAGNOSIS — L570 Actinic keratosis: Secondary | ICD-10-CM

## 2021-02-08 DIAGNOSIS — Z85828 Personal history of other malignant neoplasm of skin: Secondary | ICD-10-CM

## 2021-02-08 DIAGNOSIS — Z1283 Encounter for screening for malignant neoplasm of skin: Secondary | ICD-10-CM

## 2021-02-08 NOTE — Patient Instructions (Addendum)
[place] @ [phone].    Biopsy, Surgery (Curettage) & Surgery (Excision) Aftercare Instructions  1. Okay to remove bandage in 24 hours  2. Wash area with soap and water  3. Apply Vaseline to area twice daily until healed (Not Neosporin)  4. Okay to cover with a Band-Aid to decrease the chance of infection or prevent irritation from clothing; also it's okay to uncover lesion at home.  5. Suture instructions: return to our office in 7-10 or 10-14 days for a nurse visit for suture removal. Variable healing with sutures, if pain or itching occurs call our office. It's okay to shower or bathe 24 hours after sutures are given.  6. The following risks may occur after a biopsy, curettage or excision: bleeding, scarring, discoloration, recurrence, infection (redness, yellow drainage, pain or swelling).  7. For questions, concerns and results call our office at [place] before 4pm & Friday before 3pm. Biopsy results will be available in 1 week.

## 2021-02-08 NOTE — Progress Notes (Signed)
   Follow-Up Visit   Subjective  Steve Huynh is a 60 y.o. male who presents for the following: Annual Exam (Patient here today skin check. Per patient he has a few lesion on his face x 6 months, per patient there is a lesion on his right cheek that does bleed when he washes is face. Check lesion on his right forearm x 6-8 months no bleeding, no pain. Personal history of non mole skin cancer. No family history of atypical moles, melanoma or non mole skin cancer. ).   The following portions of the chart were reviewed this encounter and updated as appropriate:  Tobacco  Allergies  Meds  Problems  Med Hx  Surg Hx  Fam Hx      Objective  Well appearing patient in no apparent distress; mood and affect are within normal limits.  A full examination was performed including scalp, head, eyes, ears, nose, lips, neck, chest, axillae, abdomen, back, buttocks, bilateral upper extremities, bilateral lower extremities, hands, feet, fingers, toes, fingernails, and toenails. All findings within normal limits unless otherwise noted below.  Head to Toe No atypical nevi   Chest (Upper Torso, Anterior), Head - Anterior (Face), Left Supraclavicular Area, Right Breast, Right Forearm - Anterior, Right Malar Cheek Erythematous patches with gritty scale.     Right Zygomatic Area Slightly pink scale        Assessment & Plan  Skin exam for malignant neoplasm Head to Toe  Yearly skin check.   AK (actinic keratosis) (6) Head - Anterior (Face); Right Forearm - Anterior; Chest (Upper Torso, Anterior); Right Breast; Right Malar Cheek; Left Supraclavicular Area  Patient will use Tolak in January on his face and chest, follow up scheduled in May 2023.  Destruction of lesion - Chest (Upper Torso, Anterior), Head - Anterior (Face), Left Supraclavicular Area, Right Breast, Right Forearm - Anterior, Right Malar Cheek Complexity: simple   Destruction method: cryotherapy   Informed consent: discussed  and consent obtained   Timeout:  patient name, date of birth, surgical site, and procedure verified Lesion destroyed using liquid nitrogen: Yes   Cryotherapy cycles:  3 Outcome: patient tolerated procedure well with no complications   Post-procedure details: wound care instructions given    Related Medications Fluorouracil (TOLAK) 4 % CREA Apply 1 application topically at bedtime. Apply to affected area qhs Monday-Sunday x 14 days  Neoplasm of uncertain behavior of skin Right Zygomatic Area  Skin / nail biopsy Type of biopsy: tangential   Informed consent: discussed and consent obtained   Timeout: patient name, date of birth, surgical site, and procedure verified   Procedure prep:  Patient was prepped and draped in usual sterile fashion (Non sterile) Prep type:  Chlorhexidine Anesthesia: the lesion was anesthetized in a standard fashion   Anesthetic:  1% lidocaine w/ epinephrine 1-100,000 local infiltration Instrument used: flexible razor blade   Hemostasis achieved with: aluminum chloride and electrodesiccation   Outcome: patient tolerated procedure well   Post-procedure details: sterile dressing applied and wound care instructions given   Dressing type: bandage and petrolatum    Specimen 1 - Surgical pathology Differential Diagnosis: R/O BCC vs SCC - cautery  Check Margins: No    I, Recia Sons, PA-C, have reviewed all documentation's for this visit.  The documentation on 02/08/21 for the exam, diagnosis, procedures and orders are all accurate and complete.

## 2021-02-28 NOTE — Progress Notes (Signed)
RTC if recurs.

## 2021-04-25 ENCOUNTER — Encounter: Payer: Self-pay | Admitting: Internal Medicine

## 2021-04-25 ENCOUNTER — Ambulatory Visit (INDEPENDENT_AMBULATORY_CARE_PROVIDER_SITE_OTHER): Payer: BC Managed Care – PPO | Admitting: Internal Medicine

## 2021-04-25 ENCOUNTER — Telehealth: Payer: Self-pay

## 2021-04-25 ENCOUNTER — Other Ambulatory Visit: Payer: Self-pay

## 2021-04-25 ENCOUNTER — Ambulatory Visit
Admission: RE | Admit: 2021-04-25 | Discharge: 2021-04-25 | Disposition: A | Discharge status: Home / Self Care | Payer: BC Managed Care – PPO | Source: Ambulatory Visit | Attending: Internal Medicine | Admitting: Internal Medicine

## 2021-04-25 VITALS — BP 128/80 | HR 89 | Temp 97.6°F

## 2021-04-25 DIAGNOSIS — R0981 Nasal congestion: Secondary | ICD-10-CM

## 2021-04-25 DIAGNOSIS — J22 Unspecified acute lower respiratory infection: Secondary | ICD-10-CM

## 2021-04-25 DIAGNOSIS — R059 Cough, unspecified: Secondary | ICD-10-CM

## 2021-04-25 LAB — POCT INFLUENZA A/B
Influenza A, POC: NEGATIVE
Influenza B, POC: NEGATIVE

## 2021-04-25 MED ORDER — DOXYCYCLINE HYCLATE 100 MG PO TABS: 100.0000 mg | ORAL_TABLET | Freq: Two times a day (BID) | ORAL | 0 refills | Status: DC

## 2021-04-25 MED ORDER — HYDROCODONE BIT-HOMATROP MBR 5-1.5 MG/5ML PO SOLN: 5.0000 mL | Freq: Three times a day (TID) | ORAL | 0 refills | Status: DC | PRN

## 2021-04-25 NOTE — Progress Notes (Signed)
° °  Subjective:    Patient ID: Ori Trejos, male    DOB: [birth year], 61 y.o.   MRN: [id number]  HPI Mr. Blanchfield and his wife went to their home in [place] this past weekend.  He began to feel ill with respiratory congestion and cough.  At times, had discolored sputum that was minimal.  No fever or shaking chills.  He took several COVID tests which were negative.  He had COVID-19 virus infection in June 2022 and had dysgeusia at the time.  Currently does not have dysgeusia.  His general health is excellent.  He takes Lipitor.  He exercises regularly and watches his weight.  Has some malaise and fatigue  Review of Systems see above     Objective:   Physical Exam Temperature 97.6 degrees, pulse 89 regular, Blood pressure 128/80 pulse oximetry 98% Skin is warm and dry.  Pharynx very slightly injected but no exudate.  TMs are clear bilaterally.  Neck is supple.  He has some coarse breath sounds/rhonchi in the left upper lobe.  Chest x-ray however is negative for pneumonia.  Rapid flu test done here in the office is negative.  A respiratory virus panel was sent to [facility] as well as a COVID PCR test.    Assessment & Plan:  Acute lower respiratory infection-has had several negative home COVID tests.  PCR test is pending.  Could have other viral infection.  Flu test was negative here in the office today.  Respiratory virus panel has been obtained.  He does have coarse breath sounds in the left upper lobe on chest auscultation.  However chest x-ray shows no evidence of pneumonia.  History of COVID-19 in June 2022  Plan: I have treated him with doxycycline 100 mg twice daily for 10 days.  Have also prescribed Hycodan 1 teaspoon every 8 hours as needed for cough.  Rest and drink fluids.  Quarantine until test results are back.  Patient will have chest x-ray

## 2021-04-25 NOTE — Patient Instructions (Signed)
Chest x-ray is negative.  Flu test here is negative.  COVID PCR and respiratory virus panel have been sent to [facility] lab.  Please take doxycycline 100 mg twice daily for 10 days and Hycodan 1 teaspoon every 8 hours as needed for cough.  May take Tylenol or Advil for fever.  Stay well-hydrated and monitor pulse oximetry.  Call if not improving in 48 hours or sooner if worse.

## 2021-04-25 NOTE — Telephone Encounter (Signed)
Patient has severe cough congestion and sinus pressure. She has had this x 5 days. No fever no SOB. He has taken 2 covid tests yesterday and today both negative. He would like to be seen. Please advise.

## 2021-04-25 NOTE — Telephone Encounter (Signed)
scheduled

## 2021-04-27 LAB — SARS-COV-2 RNA (COVID-19) RESP VIRAL PNL QL NAAT

## 2021-04-27 LAB — TIQ-NTM

## 2021-04-29 ENCOUNTER — Other Ambulatory Visit: Payer: Self-pay | Admitting: Cardiology

## 2021-05-18 ENCOUNTER — Other Ambulatory Visit: Payer: Self-pay

## 2021-05-18 ENCOUNTER — Other Ambulatory Visit (INDEPENDENT_AMBULATORY_CARE_PROVIDER_SITE_OTHER): Payer: BC Managed Care – PPO | Admitting: Internal Medicine

## 2021-05-18 DIAGNOSIS — Z8639 Personal history of other endocrine, nutritional and metabolic disease: Secondary | ICD-10-CM | POA: Diagnosis not present

## 2021-05-18 DIAGNOSIS — Z0000 Encounter for general adult medical examination without abnormal findings: Secondary | ICD-10-CM

## 2021-05-18 DIAGNOSIS — Z8249 Family history of ischemic heart disease and other diseases of the circulatory system: Secondary | ICD-10-CM

## 2021-05-18 DIAGNOSIS — Z125 Encounter for screening for malignant neoplasm of prostate: Secondary | ICD-10-CM | POA: Diagnosis not present

## 2021-05-18 LAB — COMPLETE METABOLIC PANEL WITH GFR
AG Ratio: 2.2 (calc) (ref 1.0–2.5)
ALT: 53 U/L — ABNORMAL HIGH (ref 9–46)
AST: 37 U/L — ABNORMAL HIGH (ref 10–35)
Albumin: 4.6 g/dL (ref 3.6–5.1)
Alkaline phosphatase (APISO): 68 U/L (ref 35–144)
BUN: 19 mg/dL (ref 7–25)
CO2: 30 mmol/L (ref 20–32)
Calcium: 10 mg/dL (ref 8.6–10.3)
Chloride: 102 mmol/L (ref 98–110)
Creat: 1.13 mg/dL (ref 0.70–1.30)
Globulin: 2.1 g/dL (calc) (ref 1.9–3.7)
Glucose, Bld: 105 mg/dL — ABNORMAL HIGH (ref 65–99)
Potassium: 4.6 mmol/L (ref 3.5–5.3)
Sodium: 140 mmol/L (ref 135–146)
Total Bilirubin: 1.1 mg/dL (ref 0.2–1.2)
Total Protein: 6.7 g/dL (ref 6.1–8.1)
eGFR: 75 mL/min/{1.73_m2} (ref >=60)

## 2021-05-18 LAB — CBC WITH DIFFERENTIAL/PLATELET
Absolute Monocytes: 554 cells/uL (ref 200–950)
Basophils Absolute: 9 cells/uL (ref 0–200)
Basophils Relative: 0.2 %
Eosinophils Absolute: 150 cells/uL (ref 15–500)
Eosinophils Relative: 3.4 %
HCT: 47.7 % (ref 38.5–50.0)
Hemoglobin: 15.9 g/dL (ref 13.2–17.1)
Lymphs Abs: 1958 cells/uL (ref 850–3900)
MCH: 29.6 pg (ref 27.0–33.0)
MCHC: 33.3 g/dL (ref 32.0–36.0)
MCV: 88.8 fL (ref 80.0–100.0)
MPV: 10.8 fL (ref 7.5–12.5)
Monocytes Relative: 12.6 %
Neutro Abs: 1729 cells/uL (ref 1500–7800)
Neutrophils Relative %: 39.3 %
Platelets: 221 10*3/uL (ref 140–400)
RBC: 5.37 10*6/uL (ref 4.20–5.80)
RDW: 12.3 % (ref 11.0–15.0)
Total Lymphocyte: 44.5 %
WBC: 4.4 10*3/uL (ref 3.8–10.8)

## 2021-05-18 LAB — LIPID PANEL
Cholesterol: 138 mg/dL (ref <200)
HDL: 48 mg/dL (ref >=40)
LDL Cholesterol (Calc): 74 mg/dL (calc)
Non-HDL Cholesterol (Calc): 90 mg/dL (calc) (ref <130)
Total CHOL/HDL Ratio: 2.9 (calc) (ref <5.0)
Triglycerides: 76 mg/dL (ref <150)

## 2021-05-18 LAB — PSA: PSA: 0.19 ng/mL (ref <=4.00)

## 2021-05-26 ENCOUNTER — Encounter: Payer: BC Managed Care – PPO | Admitting: Internal Medicine

## 2021-05-29 ENCOUNTER — Other Ambulatory Visit: Payer: Self-pay | Admitting: Cardiology

## 2021-05-30 ENCOUNTER — Encounter: Payer: Self-pay | Admitting: Internal Medicine

## 2021-05-30 ENCOUNTER — Telehealth: Payer: Self-pay | Admitting: Internal Medicine

## 2021-05-30 ENCOUNTER — Other Ambulatory Visit: Payer: Self-pay

## 2021-05-30 ENCOUNTER — Ambulatory Visit (INDEPENDENT_AMBULATORY_CARE_PROVIDER_SITE_OTHER): Payer: BC Managed Care – PPO | Admitting: Internal Medicine

## 2021-05-30 VITALS — BP 128/70 | HR 83 | Temp 98.2°F | Ht 73.75 in | Wt 199.8 lb

## 2021-05-30 DIAGNOSIS — Z0000 Encounter for general adult medical examination without abnormal findings: Secondary | ICD-10-CM | POA: Diagnosis not present

## 2021-05-30 LAB — POCT URINALYSIS DIPSTICK
Bilirubin, UA: NEGATIVE
Blood, UA: NEGATIVE
Glucose, UA: NEGATIVE
Ketones, UA: NEGATIVE
Leukocytes, UA: NEGATIVE
Nitrite, UA: NEGATIVE
Protein, UA: NEGATIVE
Spec Grav, UA: 1.02 (ref 1.010–1.025)
Urobilinogen, UA: 0.2 E.U./dL
pH, UA: 6 (ref 5.0–8.0)

## 2021-05-30 NOTE — Patient Instructions (Signed)
Vaccines discussed.

## 2021-05-30 NOTE — Progress Notes (Incomplete)
Nes discussed. Wi  Subjective:    Patient ID: Steve Huynh, male    DOB: 25-Aug-1960, 61 y.o.   MRN: [id number]  HPI 61 year old Male for     Review of Systems     Objective:   Physical Exam   BP 128/70      Assessment & Plan:

## 2021-05-30 NOTE — Telephone Encounter (Signed)
Spoke with Caryl Pina at Dr Perley Jain office and she is working from home and will send note to [place], Dr Hoyle Sauer nurse about patients last colonoscopy, she will have last one faxed to [place] and also ask if patient should go ahead and have one now under new guidelines.  ?Caryl Pina got our phone number and fax number and said we would be hearing from [place]. ?

## 2021-06-04 NOTE — Progress Notes (Unsigned)
Cardiology Office Note:    Date:  06/04/2021   ID:  Steve Huynh, DOB 01-11-1961, MRN [id number]  PCP:  Elby Showers, MD  Cardiologist:  None  Electrophysiologist:  None   Referring MD: Elby Showers, MD   No chief complaint on file.   History of Present Illness:    Steve Huynh is a 61 y.o. male with a hx of basal cell carcinoma, hyperlipidemia, family history of CAD who presents for follow-up.  He was referred by Dr. Renold Genta for cardiovascular risk assessment, initially seen on 04/27/2019.  He reports that he was started on atorvastatin 80 mg daily in 2007 for hyperlipidemia.  States that cholesterol has been well controlled since that time and his dose had been decreased to 40 mg daily.  Father underwent PCI in his early 30s and had a stroke at age 20.    Calcium score on 05/28/2019 was 233 (86 percentile).  Atorvastatin dose was increased to 80 mg daily.  Since last clinic visit,   he reports that he is doing well.  Denies any chest pain, dyspnea, lightheadedness, syncope, lower extremity edema, or palpitations.  Goes to gym 3 days per week and does resistance training.  Walks/jogs on treadmill 1-2 times per week for 30 minutes.  Also will go on hikes intermittently for 8 to 10 miles.  Denies any exertional symptoms.  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     Allergies:   Patient has no known allergies.   Social History   Socioeconomic History   Marital status: Married    Spouse name: Not on file   Number of children: Not on file   Years of education: Not on file   Highest education level: Not on file  Occupational History   Not on file  Tobacco Use   Smoking status: Never   Smokeless tobacco: Never  Substance and Sexual Activity   Alcohol use: Not Currently    Comment: NO   Drug use: Not on file   Sexual activity: Not on file  Other Topics Concern   Not on file  Social History Narrative   Not on file   Social Determinants of Health   Financial Resource Strain: Not on file  Food Insecurity: Not on file  Transportation Needs: Not on file  Physical Activity: Not on file  Stress: Not on file  Social Connections: Not on file     Family History: The patient's family history includes Heart attack in his father; Kidney failure in his father; Stroke in his father.  ROS:  Please see the history of present illness.     All other systems reviewed and are negative.  EKGs/Labs/Other Studies Reviewed:    The following studies were reviewed today:   EKG:  EKG is ordered today.  The ekg ordered today demonstrates normal sinus rhythm, rate 73, no ST/T abnormalities  Recent Labs: 05/18/2021: ALT 53; BUN 19; Creat 1.13; Hemoglobin 15.9; Platelets 221; Potassium 4.6; Sodium 140  Recent Lipid Panel    Component Value Date/Time   CHOL 138 05/18/2021 0930   CHOL 106 10/13/2020 0912   TRIG 76 05/18/2021 0930   HDL 48 05/18/2021 0930   HDL 43 10/13/2020 0912   CHOLHDL 2.9 05/18/2021 0930   LDLCALC 74 05/18/2021 0930    Physical Exam:    VS:  There were no vitals taken for this visit.     Wt Readings from Last 3 Encounters:  05/30/21 199 lb 12 oz (90.6 kg)  05/10/20 198 lb (89.8 kg)  05/10/20 199 lb (90.3 kg)     GEN:  Well nourished, well developed in no acute distress HEENT: Normal NECK: No JVD CARDIAC: RRR, no murmurs, rubs, gallops RESPIRATORY:  Clear to auscultation without rales, wheezing or rhonchi  ABDOMEN: Soft, non-tender, non-distended MUSCULOSKELETAL:  No edema; No deformity  SKIN: Warm and dry NEUROLOGIC:  Alert and oriented x 3 PSYCHIATRIC:  Normal affect   ASSESSMENT:    No diagnosis found.  PLAN:    CAD: Calcium score on 05/28/2019 was 233 (86 percentile).  LDL 72 on atorvastatin 80 mg daily.  No anginal symptoms. -Continue atorvastatin 80 mg daily and Zetia 10 mg daily  Hyperlipidemia: On atorvastatin 80 mg daily and Zetia 10 mg daily.  LDL 74 on 05/18/2021.  RTC ***  Medication Adjustments/Labs and Tests Ordered: Current medicines are reviewed at length with the patient today.  Concerns regarding medicines are outlined above.  No orders of the defined types were placed in this encounter.  No orders of the defined types were placed in this encounter.   There are no Patient Instructions on file for this visit.   Signed, Donato Heinz, MD  06/04/2021 5:57 PM    [place]

## 2021-06-08 ENCOUNTER — Other Ambulatory Visit: Payer: Self-pay

## 2021-06-08 ENCOUNTER — Encounter: Payer: Self-pay | Admitting: Internal Medicine

## 2021-06-08 ENCOUNTER — Ambulatory Visit (INDEPENDENT_AMBULATORY_CARE_PROVIDER_SITE_OTHER): Payer: BC Managed Care – PPO | Admitting: Cardiology

## 2021-06-08 VITALS — BP 136/86 | HR 86 | Ht 73.0 in | Wt 200.8 lb

## 2021-06-08 DIAGNOSIS — E785 Hyperlipidemia, unspecified: Secondary | ICD-10-CM | POA: Diagnosis not present

## 2021-06-08 DIAGNOSIS — I2510 Atherosclerotic heart disease of native coronary artery without angina pectoris: Secondary | ICD-10-CM | POA: Diagnosis not present

## 2021-06-08 MED ORDER — ROSUVASTATIN CALCIUM 40 MG PO TABS: 40.0000 mg | ORAL_TABLET | Freq: Every day | ORAL | 3 refills | Status: DC

## 2021-06-08 NOTE — Patient Instructions (Signed)
Medication Instructions:  ?STOP atorvastatin (Lipitor) ?START rosuvastatin (Crestor) 40 mg daily ? ?*If you need a refill on your cardiac medications before your next appointment, please call your pharmacy* ? ?Follow-Up: ?At [facility], you and your health needs are our priority.  As part of our continuing mission to provide you with exceptional heart care, we have created designated Provider Care Teams.  These Care Teams include your primary Cardiologist (physician) and Advanced Practice Providers (APPs -  Physician Assistants and Nurse Practitioners) who all work together to provide you with the care you need, when you need it. ? ?We recommend signing up for the patient portal called "MyChart".  Sign up information is provided on this After Visit Summary.  MyChart is used to connect with patients for Virtual Visits (Telemedicine).  Patients are able to view lab/test results, encounter notes, upcoming appointments, etc.  Non-urgent messages can be sent to your provider as well.   ?To learn more about what you can do with MyChart, go to [email or web address].   ? ?Your next appointment:   ?12 month(s) ? ?The format for your next appointment:   ?In Person ? ?Provider:   ?Dr. Gardiner Rhyme ? ?

## 2021-08-02 ENCOUNTER — Encounter: Payer: Self-pay | Admitting: Internal Medicine

## 2021-08-02 ENCOUNTER — Telehealth: Payer: Self-pay | Admitting: Internal Medicine

## 2021-08-02 NOTE — Telephone Encounter (Signed)
He was here recently for CPE. Labs showed elevated serum glucose and elevated SGOT and SGPT. He will come tomorrow fasting and have Hgb AIC and C-met drawn due to these lab findings. DX: elevated LFTs and elevated serum glucose. MJB ?

## 2021-08-03 ENCOUNTER — Other Ambulatory Visit: Payer: BC Managed Care – PPO

## 2021-08-03 DIAGNOSIS — R748 Abnormal levels of other serum enzymes: Secondary | ICD-10-CM | POA: Diagnosis not present

## 2021-08-03 DIAGNOSIS — R7301 Impaired fasting glucose: Secondary | ICD-10-CM

## 2021-08-03 NOTE — Telephone Encounter (Signed)
Labs drawn this am.

## 2021-08-04 LAB — COMPLETE METABOLIC PANEL WITH GFR
AG Ratio: 2 (calc) (ref 1.0–2.5)
ALT: 44 U/L (ref 9–46)
AST: 31 U/L (ref 10–35)
Albumin: 4.5 g/dL (ref 3.6–5.1)
Alkaline phosphatase (APISO): 71 U/L (ref 35–144)
BUN: 18 mg/dL (ref 7–25)
CO2: 29 mmol/L (ref 20–32)
Calcium: 9.7 mg/dL (ref 8.6–10.3)
Chloride: 104 mmol/L (ref 98–110)
Creat: 0.95 mg/dL (ref 0.70–1.30)
Globulin: 2.3 g/dL (calc) (ref 1.9–3.7)
Glucose, Bld: 105 mg/dL — ABNORMAL HIGH (ref 65–99)
Potassium: 4.8 mmol/L (ref 3.5–5.3)
Sodium: 140 mmol/L (ref 135–146)
Total Bilirubin: 0.9 mg/dL (ref 0.2–1.2)
Total Protein: 6.8 g/dL (ref 6.1–8.1)
eGFR: 92 mL/min/{1.73_m2} (ref >=60)

## 2021-08-04 LAB — HEMOGLOBIN A1C
Hgb A1c MFr Bld: 5.3 % of total Hgb (ref <5.7)
Mean Plasma Glucose: 105 mg/dL
eAG (mmol/L): 5.8 mmol/L

## 2021-08-06 ENCOUNTER — Other Ambulatory Visit: Payer: Self-pay | Admitting: Cardiology

## 2021-08-08 ENCOUNTER — Ambulatory Visit: Payer: BC Managed Care – PPO | Admitting: Physician Assistant

## 2021-10-31 ENCOUNTER — Other Ambulatory Visit: Payer: Self-pay | Admitting: Cardiology

## 2022-02-01 ENCOUNTER — Telehealth: Payer: Self-pay | Admitting: Internal Medicine

## 2022-02-01 MED ORDER — FINASTERIDE 1 MG PO TABS: ORAL_TABLET | ORAL | 3 refills | Status: DC

## 2022-02-01 NOTE — Telephone Encounter (Signed)
Received Fax RX request from  [facility], [place] Phone: [phone]  Fax: [phone]      Medication - finasteride (PROPECIA) 1 MG tablet   Last Refill - 11/02/2021  Last OV - 05/30/2021  Last CPE - 05/30/2021  Next Appointment -

## 2022-02-01 NOTE — Addendum Note (Signed)
Addended by: Geradine Girt D on: 02/01/2022 03:57 PM   Modules accepted: Orders

## 2022-04-02 DIAGNOSIS — D234 Other benign neoplasm of skin of scalp and neck: Secondary | ICD-10-CM | POA: Diagnosis not present

## 2022-04-30 ENCOUNTER — Ambulatory Visit: Payer: BC Managed Care – PPO | Admitting: Dermatology

## 2022-06-26 ENCOUNTER — Other Ambulatory Visit (HOSPITAL_COMMUNITY)
Admission: RE | Admit: 2022-06-26 | Discharge: 2022-06-26 | Disposition: A | Discharge status: Home / Self Care | Payer: BC Managed Care – PPO | Source: Ambulatory Visit | Attending: Plastic Surgery | Admitting: Plastic Surgery

## 2022-06-26 ENCOUNTER — Other Ambulatory Visit: Payer: Self-pay | Admitting: Plastic Surgery

## 2022-06-26 DIAGNOSIS — C4441 Basal cell carcinoma of skin of scalp and neck: Secondary | ICD-10-CM | POA: Insufficient documentation

## 2022-06-27 LAB — SURGICAL PATHOLOGY

## 2022-07-25 ENCOUNTER — Telehealth: Payer: Self-pay | Admitting: Cardiology

## 2022-07-25 DIAGNOSIS — E785 Hyperlipidemia, unspecified: Secondary | ICD-10-CM

## 2022-07-25 DIAGNOSIS — I2510 Atherosclerotic heart disease of native coronary artery without angina pectoris: Secondary | ICD-10-CM

## 2022-07-25 NOTE — Telephone Encounter (Signed)
Calling to see if he has any lab work due. Please advise

## 2022-07-25 NOTE — Telephone Encounter (Signed)
Returned call to patient, advised him that I would check with Dr. Bjorn Pippin to see if lab work is needed  prior to appointment. Patient verbalized understanding.

## 2022-07-26 NOTE — Telephone Encounter (Signed)
Recommend fasting lipid panel and CMET prior to appointment on 5/10

## 2022-07-26 NOTE — Telephone Encounter (Signed)
Orders placed. Patient aware. 

## 2022-07-30 LAB — LIPID PANEL
Chol/HDL Ratio: 2.4 ratio (ref 0.0–5.0)
Cholesterol, Total: 109 mg/dL (ref 100–199)
HDL: 45 mg/dL (ref >39)
LDL Chol Calc (NIH): 50 mg/dL (ref 0–99)
Triglycerides: 61 mg/dL (ref 0–149)
VLDL Cholesterol Cal: 14 mg/dL (ref 5–40)

## 2022-07-30 LAB — COMPREHENSIVE METABOLIC PANEL
ALT: 39 IU/L (ref 0–44)
AST: 32 IU/L (ref 0–40)
Albumin/Globulin Ratio: 2.4 — ABNORMAL HIGH (ref 1.2–2.2)
Albumin: 4.4 g/dL (ref 3.8–4.9)
Alkaline Phosphatase: 64 IU/L (ref 44–121)
BUN/Creatinine Ratio: 14 (ref 10–24)
BUN: 15 mg/dL (ref 8–27)
Bilirubin Total: 0.8 mg/dL (ref 0.0–1.2)
CO2: 27 mmol/L (ref 20–29)
Calcium: 9.6 mg/dL (ref 8.6–10.2)
Chloride: 102 mmol/L (ref 96–106)
Creatinine, Ser: 1.1 mg/dL (ref 0.76–1.27)
Globulin, Total: 1.8 g/dL (ref 1.5–4.5)
Glucose: 99 mg/dL (ref 70–99)
Potassium: 4.7 mmol/L (ref 3.5–5.2)
Sodium: 142 mmol/L (ref 134–144)
Total Protein: 6.2 g/dL (ref 6.0–8.5)
eGFR: 77 mL/min/{1.73_m2} (ref >59)

## 2022-07-30 NOTE — Progress Notes (Unsigned)
Cardiology Office Note:    Date:  07/30/2022   ID:  Steve Huynh, DOB [birth year], MRN [id number]  PCP:  Margaree Mackintosh, MD  Cardiologist:  None  Electrophysiologist:  None   Referring MD: Margaree Mackintosh, MD   No chief complaint on file.   History of Present Illness:    Steve Huynh is a 62 y.o. male with a hx of basal cell carcinoma, hyperlipidemia, family history of CAD who presents for follow-up.  He was referred by Dr. Lenord Fellers for cardiovascular risk assessment, initially seen on 04/27/2019.  He reports that he was started on atorvastatin 80 mg daily in 2007 for hyperlipidemia.  States that cholesterol has been well controlled since that time and his dose had been decreased to 40 mg daily.  Father underwent PCI in his early 68s and had a stroke at age 60.    Calcium score on 05/28/2019 was 233 (86 percentile).  Atorvastatin dose was increased to 80 mg daily.  Since last clinic visit, he reports he is doing well.  Denies any chest pain, dyspnea, lightheadedness, syncope, lower extremity edema, or palpitations.  Lifts weights 3 days per week, walks up to 6-7 miles.  No exertional symptoms.  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     Allergies:   Patient has no known allergies.   Social History   Socioeconomic History   Marital status: Married    Spouse name: Not on file   Number of children: Not on file   Years of education: Not on file   Highest education level: Not on file  Occupational History   Not on file  Tobacco Use   Smoking status: Never   Smokeless tobacco: Never  Substance and Sexual Activity   Alcohol use: Not Currently    Comment: NO   Drug use: Not on file   Sexual activity: Not on file  Other Topics Concern   Not on file  Social History Narrative   Not on file   Social Determinants of Health   Financial Resource Strain: Not on file  Food Insecurity: Not on file  Transportation Needs: Not on file  Physical Activity: Not on file  Stress: Not on file  Social Connections: Not on file     Family History: The patient's family history includes Heart attack in his father; Kidney failure in his father; Stroke in his father.  ROS:   Please see the history of present illness.     All other systems reviewed and are negative.  EKGs/Labs/Other Studies Reviewed:    The following studies were reviewed today:   EKG:   06/08/21: Normal sinus rhythm, rate 83, no ST abnormalities 08/03/2022: Normal sinus rhythm, rate 75, no ST abnormality   Recent Labs: 07/30/2022: ALT 39; BUN 15; Creatinine, Ser 1.10; Potassium 4.7; Sodium 142  Recent Lipid Panel    Component Value Date/Time   CHOL 109 07/30/2022 0821   TRIG 61 07/30/2022 0821   HDL 45 07/30/2022 0821   CHOLHDL 2.4 07/30/2022 0821   CHOLHDL 2.9 05/18/2021 0930   LDLCALC 50 07/30/2022 0821   LDLCALC 74 05/18/2021 0930    Physical Exam:    VS:  There were no vitals taken for this visit.    Wt Readings from Last 3 Encounters:  06/08/21 200 lb 12.8 oz (91.1 kg)  05/30/21 199 lb 12 oz (90.6 kg)  05/10/20  198 lb (89.8 kg)     GEN:  Well nourished, well developed in no acute distress HEENT: Normal NECK: No JVD CARDIAC: RRR, no murmurs, rubs, gallops RESPIRATORY:  Clear to auscultation without rales, wheezing or rhonchi  ABDOMEN: Soft, non-tender, non-distended MUSCULOSKELETAL:  No edema; No deformity  SKIN: Warm and dry NEUROLOGIC:  Alert and oriented x 3 PSYCHIATRIC:  Normal affect   ASSESSMENT:    No diagnosis found.   PLAN:    CAD: Calcium score on 05/28/2019 was 233 (86 percentile).  LDL 74 on atorvastatin 80 mg daily and Zetia 10 mg daily.  No anginal symptoms.  Switch from atorvastatin to rosuvastatin 40 mg daily, LDL 50 on 07/30/2022 -Continue rosuvastatin  Hyperlipidemia: On rosuvastatin 40 mg daily and Zetia 10 mg daily, LDL 50 on 07/30/2022  RTC 1 year  Medication Adjustments/Labs and Tests Ordered: Current medicines are reviewed at length with the patient today.  Concerns regarding medicines are outlined above.  No orders of the defined types were placed in this encounter.  No orders of the defined types were placed in this encounter.   There are no Patient Instructions on file for this visit.   Signed, Little Ishikawa, MD  07/30/2022 10:43 PM    [facility]

## 2022-07-31 ENCOUNTER — Other Ambulatory Visit: Payer: Self-pay | Admitting: Cardiology

## 2022-08-03 ENCOUNTER — Encounter: Payer: Self-pay | Admitting: Cardiology

## 2022-08-03 ENCOUNTER — Ambulatory Visit: Payer: Self-pay | Attending: Cardiology | Admitting: Cardiology

## 2022-08-03 VITALS — BP 120/84 | HR 75 | Ht 73.0 in | Wt 196.0 lb

## 2022-08-03 DIAGNOSIS — I2510 Atherosclerotic heart disease of native coronary artery without angina pectoris: Secondary | ICD-10-CM

## 2022-08-03 DIAGNOSIS — E785 Hyperlipidemia, unspecified: Secondary | ICD-10-CM

## 2022-08-03 NOTE — Patient Instructions (Signed)
Medication Instructions:  Your physician recommends that you continue on your current medications as directed. Please refer to the Current Medication list given to you today.  *If you need a refill on your cardiac medications before your next appointment, please call your pharmacy*  Follow-Up: At [facility], you and your health needs are our priority.  As part of our continuing mission to provide you with exceptional heart care, we have created designated Provider Care Teams.  These Care Teams include your primary Cardiologist (physician) and Advanced Practice Providers (APPs -  Physician Assistants and Nurse Practitioners) who all work together to provide you with the care you need, when you need it.  We recommend signing up for the patient portal called "MyChart".  Sign up information is provided on this After Visit Summary.  MyChart is used to connect with patients for Virtual Visits (Telemedicine).  Patients are able to view lab/test results, encounter notes, upcoming appointments, etc.  Non-urgent messages can be sent to your provider as well.   To learn more about what you can do with MyChart, go to [email or web address].    Your next appointment:   12 month(s)  Provider:   Dr. Schumann  

## 2022-08-06 NOTE — Progress Notes (Shared)
Patient Care Team: Margaree Mackintosh, MD as PCP - General (Internal Medicine) Suzi Roots as Physician Assistant (Dermatology)  Visit Date: 08/06/22  Subjective:    Patient ID: Steve Huynh , Male   DOB: 04/19/1960, 62 y.o.    MRN: [id number]   62 y.o. Male presents today for annual comprehensive physical exam.  History of basal cell carcinomas, squamous cell carcinoma.  History of hyperlipidemia treated with ezetimibe 10 mg daily, rosuvastatin 40 mg daily.  PSA at   Colonoscopy last completed 11/10/12. Results showed external and internal hemorrhoids. Examination otherwise normal. Recommended repeat in 2024.   Vaccine counseling: due for shingles vaccine. UTD on flu, tetanus vaccines.  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   Physical Exam    Results:   Studies obtained and personally reviewed by me:  Imaging, colonoscopy, mammogram, bone density scan, echocardiogram, heart cath, stress test, CT calcium score, etc. ***   Labs:       Component Value Date/Time   NA 142  07/30/2022 0821   K 4.7 07/30/2022 0821   CL 102 07/30/2022 0821   CO2 27 07/30/2022 0821   GLUCOSE 99 07/30/2022 0821   GLUCOSE 105 (H) 08/03/2021 0956   BUN 15 07/30/2022 0821   CREATININE 1.10 07/30/2022 0821   CREATININE 0.95 08/03/2021 0956   CALCIUM 9.6 07/30/2022 0821   PROT 6.2 07/30/2022 0821   ALBUMIN 4.4 07/30/2022 0821   AST 32 07/30/2022 0821   ALT 39 07/30/2022 0821   ALKPHOS 64 07/30/2022 0821   BILITOT 0.8 07/30/2022 0821   GFRNONAA 87 05/06/2020 0942   GFRAA 101 05/06/2020 0942     Lab Results  Component Value Date   WBC 4.4 05/18/2021   HGB 15.9 05/18/2021   HCT 47.7 05/18/2021   MCV 88.8 05/18/2021   PLT 221 05/18/2021    Lab Results  Component Value Date   CHOL 109 07/30/2022   HDL 45 07/30/2022   LDLCALC 50 07/30/2022   TRIG 61 07/30/2022   CHOLHDL 2.4 07/30/2022    Lab Results  Component Value Date   HGBA1C 5.3 08/03/2021     No results found for: "TSH"   Lab Results  Component Value Date   PSA 0.19 05/18/2021   PSA 0.16 05/06/2020   PSA <0.1 04/21/2019   *** delete for male pts  ***    Assessment & Plan:   ***  I,Alexander Ruley,acting as a Education administrator for Elby Showers, MD.,have documented all relevant documentation on the behalf of Elby Showers, MD,as directed by  Elby Showers, MD while in the presence of Elby Showers, MD.   ***

## 2022-08-07 ENCOUNTER — Telehealth: Payer: Self-pay

## 2022-08-07 NOTE — Telephone Encounter (Signed)
PA submitted for Finasteride 08/06/22 with a diagnosis for hair loss per Dr Lenord Fellers. PA was denied. He has been made aware and that if he uses goodrx[employer]com coupon it is $28 a month. He is ok with the outcome and will pay out of pocket.

## 2022-08-09 ENCOUNTER — Other Ambulatory Visit: Payer: No Typology Code available for payment source

## 2022-08-09 DIAGNOSIS — E785 Hyperlipidemia, unspecified: Secondary | ICD-10-CM

## 2022-08-09 DIAGNOSIS — R7301 Impaired fasting glucose: Secondary | ICD-10-CM

## 2022-08-09 DIAGNOSIS — Z125 Encounter for screening for malignant neoplasm of prostate: Secondary | ICD-10-CM

## 2022-08-09 DIAGNOSIS — Z136 Encounter for screening for cardiovascular disorders: Secondary | ICD-10-CM

## 2022-08-09 LAB — CBC WITH DIFFERENTIAL/PLATELET
Absolute Monocytes: 557 cells/uL (ref 200–950)
Basophils Relative: 0.8 %
Eosinophils Absolute: 173 cells/uL (ref 15–500)
Eosinophils Relative: 3.6 %
Hemoglobin: 16.1 g/dL (ref 13.2–17.1)
MCH: 30 pg (ref 27.0–33.0)
Monocytes Relative: 11.6 %
Neutro Abs: 2021 cells/uL (ref 1500–7800)
Platelets: 203 10*3/uL (ref 140–400)
WBC: 4.8 10*3/uL (ref 3.8–10.8)

## 2022-08-09 NOTE — Addendum Note (Signed)
Addended by: Johney Frame A on: 08/09/2022 03:32 PM   Modules accepted: Orders

## 2022-08-10 ENCOUNTER — Encounter: Payer: Self-pay | Admitting: Internal Medicine

## 2022-08-10 ENCOUNTER — Ambulatory Visit: Payer: No Typology Code available for payment source | Admitting: Internal Medicine

## 2022-08-10 VITALS — BP 132/82 | HR 83 | Temp 98.0°F | Ht 73.5 in | Wt 201.0 lb

## 2022-08-10 DIAGNOSIS — E785 Hyperlipidemia, unspecified: Secondary | ICD-10-CM | POA: Diagnosis not present

## 2022-08-10 DIAGNOSIS — Z0000 Encounter for general adult medical examination without abnormal findings: Secondary | ICD-10-CM

## 2022-08-10 DIAGNOSIS — Z23 Encounter for immunization: Secondary | ICD-10-CM

## 2022-08-10 LAB — CBC WITH DIFFERENTIAL/PLATELET
Basophils Absolute: 38 cells/uL (ref 0–200)
HCT: 48.3 % (ref 38.5–50.0)
Lymphs Abs: 2011 cells/uL (ref 850–3900)
MCHC: 33.3 g/dL (ref 32.0–36.0)
MCV: 89.9 fL (ref 80.0–100.0)
MPV: 10.9 fL (ref 7.5–12.5)
Neutrophils Relative %: 42.1 %
RBC: 5.37 10*6/uL (ref 4.20–5.80)
RDW: 12.5 % (ref 11.0–15.0)
Total Lymphocyte: 41.9 %

## 2022-08-10 LAB — COMPLETE METABOLIC PANEL WITH GFR
AG Ratio: 2.2 (calc) (ref 1.0–2.5)
ALT: 40 U/L (ref 9–46)
AST: 30 U/L (ref 10–35)
Albumin: 4.4 g/dL (ref 3.6–5.1)
Alkaline phosphatase (APISO): 66 U/L (ref 35–144)
BUN: 15 mg/dL (ref 7–25)
CO2: 29 mmol/L (ref 20–32)
Calcium: 9.5 mg/dL (ref 8.6–10.3)
Chloride: 103 mmol/L (ref 98–110)
Creat: 1.02 mg/dL (ref 0.70–1.35)
Globulin: 2 g/dL (calc) (ref 1.9–3.7)
Glucose, Bld: 102 mg/dL — ABNORMAL HIGH (ref 65–99)
Potassium: 5.1 mmol/L (ref 3.5–5.3)
Sodium: 139 mmol/L (ref 135–146)
Total Bilirubin: 0.6 mg/dL (ref 0.2–1.2)
Total Protein: 6.4 g/dL (ref 6.1–8.1)
eGFR: 84 mL/min/{1.73_m2} (ref >=60)

## 2022-08-10 LAB — POCT URINALYSIS DIPSTICK
Bilirubin, UA: NEGATIVE
Blood, UA: NEGATIVE
Glucose, UA: NEGATIVE
Ketones, UA: NEGATIVE
Leukocytes, UA: NEGATIVE
Nitrite, UA: NEGATIVE
Protein, UA: NEGATIVE
Spec Grav, UA: 1.01 (ref 1.010–1.025)
Urobilinogen, UA: 0.2 E.U./dL
pH, UA: 5 (ref 5.0–8.0)

## 2022-08-10 LAB — HEMOCCULT GUIAC POC 1CARD (OFFICE): Fecal Occult Blood, POC: NEGATIVE

## 2022-08-10 LAB — LIPID PANEL
Cholesterol: 125 mg/dL (ref <200)
HDL: 51 mg/dL (ref >=40)
LDL Cholesterol (Calc): 57 mg/dL (calc)
Non-HDL Cholesterol (Calc): 74 mg/dL (calc) (ref <130)
Total CHOL/HDL Ratio: 2.5 (calc) (ref <5.0)
Triglycerides: 91 mg/dL (ref <150)

## 2022-08-10 LAB — PSA: PSA: 0.1 ng/mL (ref <=4.00)

## 2022-08-10 LAB — HEMOGLOBIN A1C
Hgb A1c MFr Bld: 5.6 % of total Hgb (ref <5.7)
Mean Plasma Glucose: 114 mg/dL
eAG (mmol/L): 6.3 mmol/L

## 2022-08-10 NOTE — Progress Notes (Signed)
Patient Care Team: Margaree Mackintosh, MD as PCP - General (Internal Medicine) Suzi Roots as Physician Assistant (Dermatology)  Visit Date: 08/10/22  Subjective:    Patient ID: Steve Huynh , Male   DOB: 10-Nov-1960, 62 y.o.    MRN: [id number]   62 y.o. Male presents today for annual comprehensive physical exam.  Notes tinnitus in both ears.  History of hyperlipidemia treated with rosuvastatin 40 mg daily, ezetimibe 10 mg daily. Coronary calcium score in March 2021 was 233. Family history of coronary disease in his father.  He is followed by [facility].  No new complaints.  Feels well.  He had ACL repair from skiing accident 17 years ago.  History of basal cell carcinoma of skin followed by Dermatology on a regular basis.  History of COVID-11 September 2020.  PSA normal at 0.10. Glucose slightly elevated at 102. Kidney, liver functions normal. Electrolytes normal. Blood proteins normal. CBC normal. Lipid panel normal. HGBA1c at 5.6%.  Colonoscopy last completed 11/10/12. Results showed external and internal hemorrhoids. Examination otherwise normal. Recommended repeat in 2024.  Social history: He is married.  1 daughter and 2 sons.  Social alcohol consumption typically at social events only.  Non-smoker.  He has a degree in Counselling psychologist from [employer] and an [id number] [employer] from [place].  Family History: Father died at age 56 with renal failure with history of coronary disease.  Father had angioplasty at age 57 and a stroke at age 43.  3 brothers: an older brother in good health, and younger twin brothers in good health.  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  HENT:  Positive for tinnitus. Negative for hearing loss, sinus pain and sore throat.   Respiratory:  Negative for cough, hemoptysis and shortness of breath.   Cardiovascular:  Negative for chest pain, palpitations, leg swelling and PND.  Gastrointestinal:  Negative for abdominal pain, constipation, diarrhea, heartburn, nausea and vomiting.  Genitourinary:  Negative for dysuria, frequency and urgency.  Musculoskeletal:  Negative for back pain, myalgias and neck pain.  Skin:  Negative for itching and rash.  Neurological:  Negative for dizziness, tingling, seizures and headaches.  Endo/Heme/Allergies:  Negative for polydipsia.  Psychiatric/Behavioral:  Negative for depression. The patient is not nervous/anxious.         Objective:   Vitals: BP 132/82   Pulse 83   Temp 98 F (36.7 C) (Tympanic)  Ht 6' 1.5" (1.867 m)   Wt 201 lb (91.2 kg)   BMI 26.16 kg/m    Physical Exam Vitals and nursing note reviewed.  Constitutional:      General: He is awake. He is not in acute distress.    Appearance: Normal appearance. He is not ill-appearing or toxic-appearing.  HENT:     Head: Normocephalic and atraumatic.     Right Ear: Tympanic membrane, ear canal and external ear normal.     Left Ear: Tympanic membrane, ear canal and external ear normal.     Mouth/Throat:     Pharynx:  Oropharynx is clear.  Eyes:     Extraocular Movements: Extraocular movements intact.     Pupils: Pupils are equal, round, and reactive to light.  Neck:     Thyroid: No thyroid mass, thyromegaly or thyroid tenderness.     Vascular: No carotid bruit.  Cardiovascular:     Rate and Rhythm: Normal rate and regular rhythm. No extrasystoles are present.    Pulses:          Dorsalis pedis pulses are 1+ on the right side and 1+ on the left side.     Heart sounds: Normal heart sounds. No murmur heard.    No friction rub. No gallop.  Pulmonary:     Effort: Pulmonary effort is normal.     Breath sounds: Normal breath sounds. No decreased breath sounds, wheezing, rhonchi or rales.  Chest:     Chest wall: No mass.  Abdominal:     Palpations: Abdomen is soft.     Tenderness: There is no abdominal tenderness.     Hernia: No hernia is present.  Genitourinary:    Prostate: Normal. Not enlarged and no nodules present.     Comments: Prostate smooth and symmetrical. Musculoskeletal:     Cervical back: Normal range of motion.     Right lower leg: No edema.     Left lower leg: No edema.  Lymphadenopathy:     Cervical: No cervical adenopathy.     Upper Body:     Right upper body: No supraclavicular adenopathy.     Left upper body: No supraclavicular adenopathy.  Skin:    General: Skin is warm and dry.  Neurological:     General: No focal deficit present.     Mental Status: He is alert and oriented to person, place, and time. Mental status is at baseline.     Cranial Nerves: Cranial nerves 2-12 are intact.     Sensory: Sensation is intact.     Motor: Motor function is intact.     Coordination: Coordination is intact.     Gait: Gait is intact.     Deep Tendon Reflexes: Reflexes are normal and symmetric.  Psychiatric:        Attention and Perception: Attention normal.        Mood and Affect: Mood normal.        Speech: Speech normal.        Behavior: Behavior normal. Behavior is cooperative.         Thought Content: Thought content normal.        Cognition and Memory: Cognition and memory normal.        Judgment: Judgment normal.       Results:   Studies obtained and personally reviewed by me:  Colonoscopy last completed 11/10/12. Results showed external and internal hemorrhoids. Examination otherwise normal. Recommended repeat in 2024.  Labs:  Component Value Date/Time   NA 139 08/09/2022 1148   NA 142 07/30/2022 0821   K 5.1 08/09/2022 1148   CL 103 08/09/2022 1148   CO2 29 08/09/2022 1148   GLUCOSE 102 (H) 08/09/2022 1148   BUN 15 08/09/2022 1148   BUN 15 07/30/2022 0821   CREATININE 1.02 08/09/2022 1148   CALCIUM 9.5 08/09/2022 1148   PROT 6.4 08/09/2022 1148   PROT 6.2 07/30/2022 0821   ALBUMIN 4.4 07/30/2022 0821   AST 30 08/09/2022 1148   ALT 40 08/09/2022 1148   ALKPHOS 64 07/30/2022 0821   BILITOT 0.6 08/09/2022 1148   BILITOT 0.8 07/30/2022 0821   GFRNONAA 87 05/06/2020 0942   GFRAA 101 05/06/2020 0942     Lab Results  Component Value Date   WBC 4.8 08/09/2022   HGB 16.1 08/09/2022   HCT 48.3 08/09/2022   MCV 89.9 08/09/2022   PLT 203 08/09/2022    Lab Results  Component Value Date   CHOL 125 08/09/2022   HDL 51 08/09/2022   LDLCALC 57 08/09/2022   TRIG 91 08/09/2022   CHOLHDL 2.5 08/09/2022    Lab Results  Component Value Date   HGBA1C 5.6 08/09/2022     No results found for: "TSH"   Lab Results  Component Value Date   PSA 0.10 08/09/2022   PSA 0.19 05/18/2021   PSA 0.16 05/06/2020      Assessment & Plan:   Hyperlipidemia: treated with rosuvastatin daily, ezetimibe 10 mg daily. Lipid panel normal.  Colonoscopy last completed 11/10/12. Results showed external and internal hemorrhoids. Examination otherwise normal. Recommended repeat in 2024. Reminded of this.  Stool negative for occult blood.  Vaccine counseling: UTD on tetanus. Administered pneumococcal 20 vaccine. Discussed shingles vaccine.  Return in 1 year  for health maintenance exam.  Labs reviewed and are stable.  I,Alexander Ruley,acting as a Neurosurgeon for Margaree Mackintosh, MD.,have documented all relevant documentation on the behalf of Margaree Mackintosh, MD,as directed by  Margaree Mackintosh, MD while in the presence of Margaree Mackintosh, MD.   I, Margaree Mackintosh, MD, have reviewed all documentation for this visit. The documentation on 09/09/22 for the exam, diagnosis, procedures, and orders are all accurate and complete.

## 2022-08-13 ENCOUNTER — Encounter: Payer: Self-pay | Admitting: Internal Medicine

## 2022-09-09 NOTE — Patient Instructions (Signed)
It was a pleasure to see you today.  Continue current medications and follow-up in 1 year or as needed.  Pneumococcal 20 vaccine given.

## 2022-09-18 ENCOUNTER — Encounter: Payer: Self-pay | Admitting: Pharmacist Clinician (PhC)/ Clinical Pharmacy Specialist

## 2022-09-18 ENCOUNTER — Ambulatory Visit
Payer: No Typology Code available for payment source | Attending: Cardiology | Admitting: Pharmacist Clinician (PhC)/ Clinical Pharmacy Specialist

## 2022-09-18 DIAGNOSIS — E7849 Other hyperlipidemia: Secondary | ICD-10-CM

## 2022-09-18 NOTE — Progress Notes (Unsigned)
Office Visit    Patient Name: Steve Huynh Date of Encounter: 09/19/2022  Primary Care Provider:  Margaree Mackintosh, MD Primary Cardiologist:  None  Chief Complaint    Hyperlipidemia   Significant Past Medical History   CAD Calcium score 233 (86th percentile)     No Known Allergies  History of Present Illness    Steve Huynh is a 62 y.o. male patient of Dr Bjorn Pippin, in the office today to discuss options for cholesterol management.  He currently takes high intensity statin as well as ezetimibe, and has an LDL at goal of < 70.  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  He is aware that with his LDL at goal, insurance will not cover the cost of additional lipid lowering therapy.    Insurance Carrier: [employer]  LDL Cholesterol goal:  LDL < 70  Current Medications:   rosuvastatin 40, ezetimibe 10   Family Hx:  father had PCI in his early 32's, stroke at 72, died renal failure at 64 (smoker lifelong); mother now 74 and healthy;  older brother with elevated cholesterol, twin younger - one with elevated cholesterol; daughter 76, son 78, 27 - healthy so far  Social Hx: Tobacco: no Alcohol: rare   Diet:   mostly home cooked meals, watches closely, lots of fish and chicken some red meat, fair amount of f/v; rare snacking   Exercise: strength training 3x week, couple days will walk 5-7 miles (good pace)  Adherence Assessment  Do you ever forget to take your medication? [] Yes [x] No  Do you ever skip doses due to side effects? [] Yes [x] No  Do you have trouble affording your medicines? [] Yes [x] No  Are you ever unable to pick up your medication due to transportation difficulties? [] Yes [x] No   Adherence strategy: none   Accessory Clinical Findings   10/23:  Lp(a) - 198  Lab Results  Component Value Date   CHOL 125 08/09/2022   HDL 51 08/09/2022   LDLCALC 57 08/09/2022   TRIG 91 08/09/2022   CHOLHDL 2.5  08/09/2022    No results found for: "LIPOA"  Lab Results  Component Value Date   ALT 40 08/09/2022   AST 30 08/09/2022   ALKPHOS 64 07/30/2022   BILITOT 0.6 08/09/2022   Lab Results  Component Value Date   CREATININE 1.02 08/09/2022   BUN 15 08/09/2022   NA 139 08/09/2022   K 5.1 08/09/2022   CL 103 08/09/2022   CO2 29 08/09/2022   Lab Results  Component Value Date   HGBA1C 5.6 08/09/2022    Home Medications    Current Outpatient Medications  Medication Sig Dispense Refill   Evolocumab (REPATHA SURECLICK) 140 MG/ML SOAJ Inject 140 mg into the skin every 14 (fourteen) days. 6 mL 3   aspirin EC 81 MG tablet Take 81 mg by mouth daily.     ezetimibe (ZETIA) 10 MG tablet TAKE 1 TABLET BY MOUTH DAILY 90 tablet 3   finasteride (PROPECIA) 1 MG tablet TAKE 1 TABLET(1 MG) BY MOUTH DAILY 90 tablet 3   rosuvastatin (CRESTOR) 40 MG tablet Take 1 tablet (40 mg total) by mouth daily. Please keep scheduled appointment for future refills. Thank you. 90 tablet 0   No current facility-administered medications for this visit.     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  Also reviewed potential options for patient assistance.  Plan: Patient agreeable to starting Repatha 140 mg q14d Repeat labs after:  3 months Lipid Liver function Lp(a)    Phillips Hay, PharmD CPP [facility] [address]  [place], [place] [address] [phone]  09/19/2022, 6:41 AM

## 2022-09-18 NOTE — Patient Instructions (Addendum)
Your Results:             Your most recent labs Goal  Total Cholesterol 125 < 200  Triglycerides 91 < 150  HDL (happy/good cholesterol) 51 > 40  LDL (lousy/bad cholesterol 57 < 70   Medication changes:  Start Repatha 140 mg every 14 days.    Lab orders:  We want to repeat labs after 2-3 months.  We will send you a lab order to remind you once we get closer to that time.     Thank you for choosing [facility]

## 2022-09-19 ENCOUNTER — Encounter: Payer: Self-pay | Admitting: Pharmacist Clinician (PhC)/ Clinical Pharmacy Specialist

## 2022-09-19 MED ORDER — REPATHA SURECLICK 140 MG/ML ~~LOC~~ SOAJ: 140.0000 mg | SUBCUTANEOUS | 3 refills | Status: DC

## 2022-09-19 NOTE — Assessment & Plan Note (Signed)
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  Also reviewed potential options for patient assistance.  Plan: Patient agreeable to starting Repatha 140 mg q14d Repeat labs after:  3 months Lipid Liver function Lp(a)

## 2022-10-05 ENCOUNTER — Other Ambulatory Visit (HOSPITAL_COMMUNITY): Payer: Self-pay

## 2022-10-05 ENCOUNTER — Telehealth: Payer: Self-pay

## 2022-10-05 NOTE — Telephone Encounter (Signed)
Pharmacy Patient Advocate Encounter   Received notification from CoverMyMeds that prior authorization for REPATHA is required/requested.   Insurance verification completed.   The patient is insured through [employer] .  PA submitted to [facility] via CoverMyMeds Key/confirmation #/EOC [id number] Status is pending

## 2022-10-05 NOTE — Telephone Encounter (Signed)
Pharmacy Patient Advocate Encounter  Received notification from [facility] that Prior Authorization for REPATHA has been APPROVED from 10/05/22 to 10/05/23.Marland Kitchen  PA #/Case ID/Reference #: [id number]

## 2022-10-29 ENCOUNTER — Other Ambulatory Visit: Payer: Self-pay | Admitting: Cardiology

## 2022-11-26 IMAGING — CR DG CHEST 2V
2 series · 2 of 2 positions shown · non-contrast
Comparison: 04/08/2018

CLINICAL DATA: Cough, chest tightness for 5 days

EXAM:
CHEST - 2 VIEW

[w chest pa]
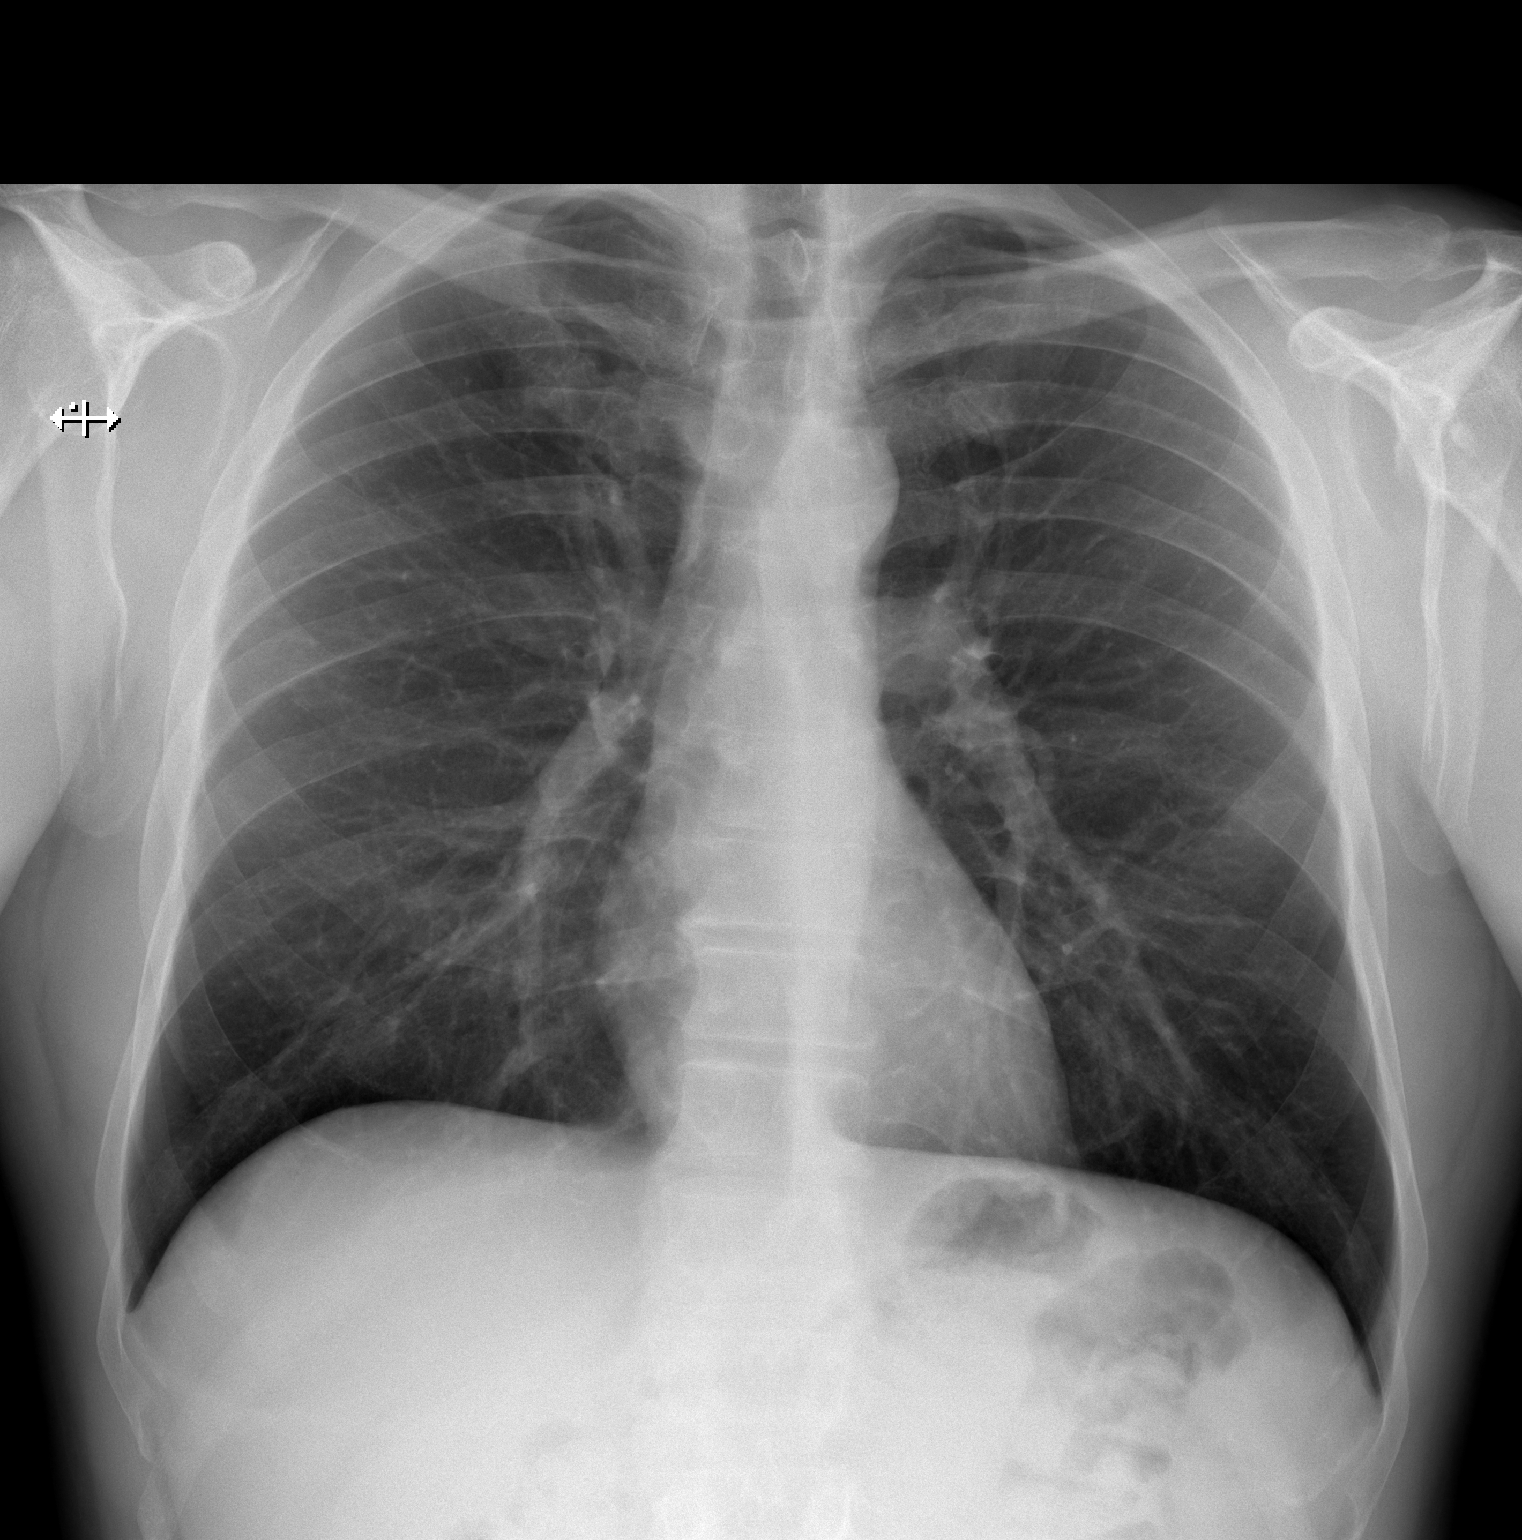

[w chest lat]
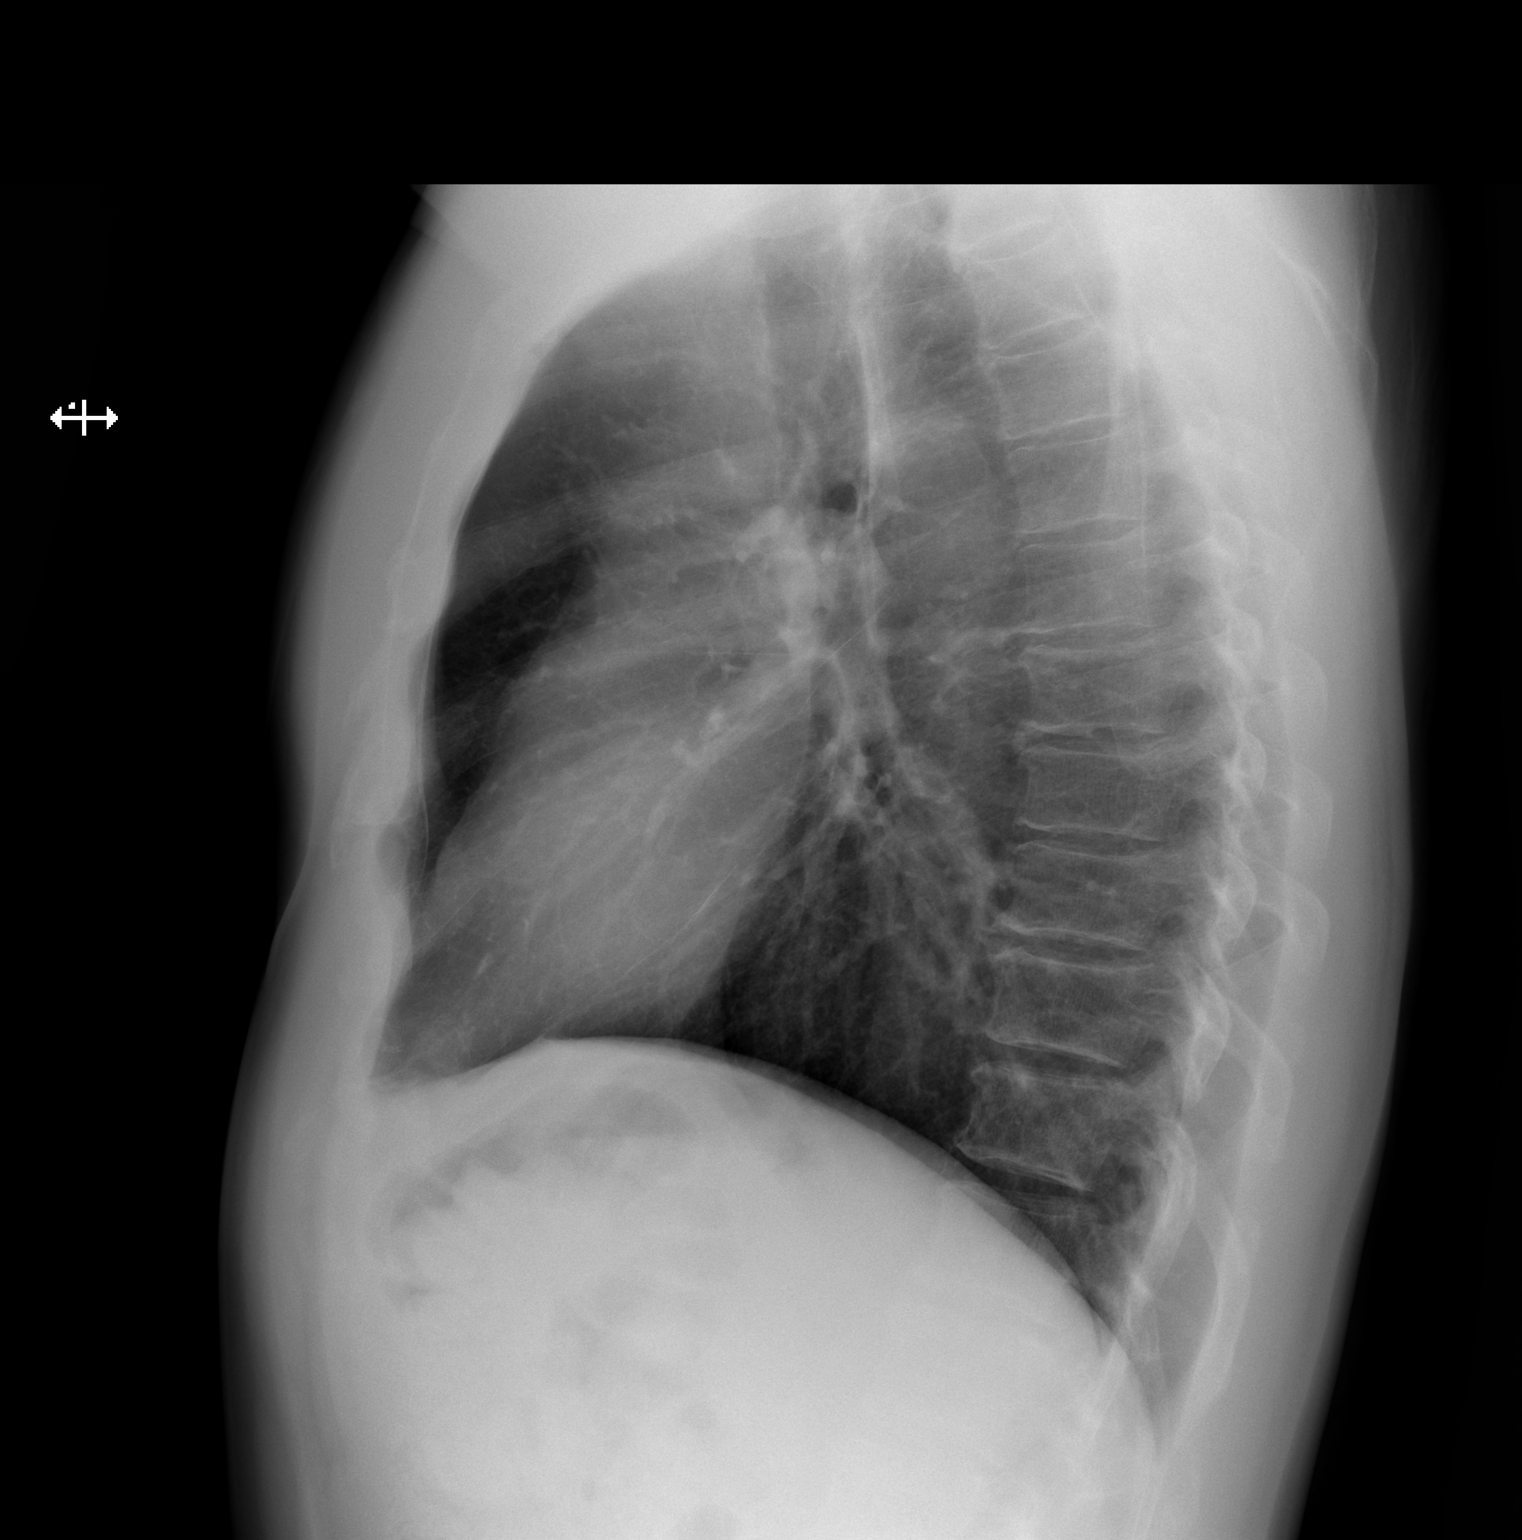

[2 of 2 positions shown; findings below may reference images not displayed]

FINDINGS: Frontal and lateral views of the chest demonstrate an unremarkable
cardiac silhouette. No airspace disease, effusion, or pneumothorax.
No acute bony abnormalities.
IMPRESSION: 1. No acute intrathoracic process.

## 2022-11-27 ENCOUNTER — Encounter: Payer: Self-pay | Admitting: Pharmacist Clinician (PhC)/ Clinical Pharmacy Specialist

## 2022-11-27 DIAGNOSIS — E7849 Other hyperlipidemia: Secondary | ICD-10-CM

## 2022-12-07 ENCOUNTER — Other Ambulatory Visit: Payer: Self-pay

## 2022-12-07 DIAGNOSIS — E7849 Other hyperlipidemia: Secondary | ICD-10-CM

## 2022-12-08 LAB — HEPATIC FUNCTION PANEL
ALT: 32 IU/L (ref 0–44)
AST: 28 IU/L (ref 0–40)
Albumin: 4.6 g/dL (ref 3.9–4.9)
Alkaline Phosphatase: 68 IU/L (ref 44–121)
Bilirubin Total: 0.7 mg/dL (ref 0.0–1.2)
Bilirubin, Direct: 0.24 mg/dL (ref 0.00–0.40)
Total Protein: 6.5 g/dL (ref 6.0–8.5)

## 2022-12-08 LAB — LIPID PANEL
Chol/HDL Ratio: 1.4 ratio (ref 0.0–5.0)
Cholesterol, Total: 73 mg/dL — ABNORMAL LOW (ref 100–199)
HDL: 51 mg/dL (ref >39)
LDL Chol Calc (NIH): 9 mg/dL (ref 0–99)
Triglycerides: 45 mg/dL (ref 0–149)
VLDL Cholesterol Cal: 13 mg/dL (ref 5–40)

## 2022-12-12 LAB — HM COLONOSCOPY

## 2023-01-16 ENCOUNTER — Other Ambulatory Visit: Payer: Self-pay | Admitting: Internal Medicine

## 2023-05-25 ENCOUNTER — Encounter: Payer: Self-pay | Admitting: Pharmacist Clinician (PhC)/ Clinical Pharmacy Specialist

## 2023-05-25 DIAGNOSIS — E7849 Other hyperlipidemia: Secondary | ICD-10-CM

## 2023-05-29 ENCOUNTER — Other Ambulatory Visit: Payer: Self-pay | Admitting: Pharmacist Clinician (PhC)/ Clinical Pharmacy Specialist

## 2023-05-29 DIAGNOSIS — E7849 Other hyperlipidemia: Secondary | ICD-10-CM

## 2023-06-08 LAB — LIPID PANEL
Chol/HDL Ratio: 1.4 ratio (ref 0.0–5.0)
Cholesterol, Total: 68 mg/dL — ABNORMAL LOW (ref 100–199)
HDL: 49 mg/dL (ref >39)
LDL Chol Calc (NIH): 5 mg/dL (ref 0–99)
Triglycerides: 53 mg/dL (ref 0–149)
VLDL Cholesterol Cal: 14 mg/dL (ref 5–40)

## 2023-06-08 LAB — HEPATIC FUNCTION PANEL
ALT: 31 IU/L (ref 0–44)
AST: 33 IU/L (ref 0–40)
Albumin: 4.5 g/dL (ref 3.9–4.9)
Alkaline Phosphatase: 67 IU/L (ref 44–121)
Bilirubin Total: 0.6 mg/dL (ref 0.0–1.2)
Bilirubin, Direct: 0.3 mg/dL (ref 0.00–0.40)
Total Protein: 6.5 g/dL (ref 6.0–8.5)

## 2023-06-09 LAB — LIPOPROTEIN A (LPA): Lipoprotein (a): 105.3 nmol/L — ABNORMAL HIGH (ref <75.0)

## 2023-06-10 ENCOUNTER — Encounter: Payer: Self-pay | Admitting: Pharmacist Clinician (PhC)/ Clinical Pharmacy Specialist

## 2023-06-24 ENCOUNTER — Other Ambulatory Visit: Payer: Self-pay | Admitting: Cardiology

## 2023-07-16 ENCOUNTER — Other Ambulatory Visit: Payer: Self-pay | Admitting: Cardiology

## 2023-08-15 ENCOUNTER — Telehealth: Payer: Self-pay

## 2023-08-15 NOTE — Telephone Encounter (Signed)
 Called and rescheduled.

## 2023-08-15 NOTE — Telephone Encounter (Signed)
 Copied from CRM [id number]. Topic: Appointments - Appointment Scheduling >> Aug 15, 2023  9:17 AM Steve Huynh wrote: Patient/patient representative is calling to schedule an appointment. Refer to attachments for appointment information.   Pt needs CPE labs prior to appt 08/23/2023

## 2023-08-20 ENCOUNTER — Other Ambulatory Visit

## 2023-08-20 DIAGNOSIS — E785 Hyperlipidemia, unspecified: Secondary | ICD-10-CM

## 2023-08-20 DIAGNOSIS — Z125 Encounter for screening for malignant neoplasm of prostate: Secondary | ICD-10-CM

## 2023-08-20 DIAGNOSIS — Z0000 Encounter for general adult medical examination without abnormal findings: Secondary | ICD-10-CM

## 2023-08-20 LAB — CBC WITH DIFFERENTIAL/PLATELET
Absolute Lymphocytes: 1802 {cells}/uL (ref 850–3900)
Absolute Monocytes: 555 {cells}/uL (ref 200–950)
Basophils Absolute: 22 {cells}/uL (ref 0–200)
Basophils Relative: 0.5 %
Eosinophils Absolute: 99 {cells}/uL (ref 15–500)
Eosinophils Relative: 2.3 %
HCT: 48.5 % (ref 38.5–50.0)
Hemoglobin: 16.1 g/dL (ref 13.2–17.1)
MCH: 30 pg (ref 27.0–33.0)
MCHC: 33.2 g/dL (ref 32.0–36.0)
MCV: 90.5 fL (ref 80.0–100.0)
MPV: 11.2 fL (ref 7.5–12.5)
Monocytes Relative: 12.9 %
Neutro Abs: 1823 {cells}/uL (ref 1500–7800)
Neutrophils Relative %: 42.4 %
Platelets: 206 10*3/uL (ref 140–400)
RBC: 5.36 10*6/uL (ref 4.20–5.80)
RDW: 12.5 % (ref 11.0–15.0)
Total Lymphocyte: 41.9 %
WBC: 4.3 10*3/uL (ref 3.8–10.8)

## 2023-08-20 LAB — COMPLETE METABOLIC PANEL WITHOUT GFR
AG Ratio: 2.4 (calc) (ref 1.0–2.5)
ALT: 28 U/L (ref 9–46)
AST: 26 U/L (ref 10–35)
Albumin: 4.8 g/dL (ref 3.6–5.1)
Alkaline phosphatase (APISO): 55 U/L (ref 35–144)
BUN: 15 mg/dL (ref 7–25)
CO2: 31 mmol/L (ref 20–32)
Calcium: 9.8 mg/dL (ref 8.6–10.3)
Chloride: 103 mmol/L (ref 98–110)
Creat: 1.05 mg/dL (ref 0.70–1.35)
Globulin: 2 g/dL (ref 1.9–3.7)
Glucose, Bld: 104 mg/dL — ABNORMAL HIGH (ref 65–99)
Potassium: 4.3 mmol/L (ref 3.5–5.3)
Sodium: 140 mmol/L (ref 135–146)
Total Bilirubin: 1 mg/dL (ref 0.2–1.2)
Total Protein: 6.8 g/dL (ref 6.1–8.1)

## 2023-08-20 LAB — LIPID PANEL
Cholesterol: 75 mg/dL (ref <200)
HDL: 50 mg/dL (ref >=40)
LDL Cholesterol (Calc): 14 mg/dL
Non-HDL Cholesterol (Calc): 25 mg/dL (ref <130)
Total CHOL/HDL Ratio: 1.5 (calc) (ref <5.0)
Triglycerides: 40 mg/dL (ref <150)

## 2023-08-20 LAB — PSA: PSA: 0.18 ng/mL (ref <=4.00)

## 2023-08-22 NOTE — Progress Notes (Signed)
 Annual Wellness Visit   Patient Care Team: Ginnifer Creelman, Jaynie Meyers, MD as PCP - General (Internal Medicine) Reggy Capers as Physician Assistant (Dermatology)  Visit Date: 08/23/23   Chief Complaint  Patient presents with   Annual Exam   Subjective:  Patient: Steve Huynh, Male DOB: [birth year], 63 y.o. MRN: [id number] Starr Engel is a 63 y.o. Male who presents today for his Annual Wellness Visit. Patient has Alopecia, Scalp; Hyperlipidemia; and IFG (Impaired Fasting Glucose).  History of Hyperlipidemia treated with Rosuvastatin  40 mg daily, Zetia  10 mg daily, and Repatha  140 mg injected fortnightly. 06/07/2023 LPA: 105.  08/20/2023 Lipid Panel: WNL.   2021 Coronary Calcium  Score: 233. Followed by Dr. Alda Amas, Cardiologist, who he saw on 08/03/2022 and is scheduled for follow-up.   History of Impaired Fasting Glucose with 08/20/2023 Glucose, compared to 07/2022: 104, elevated from 102.   History of Alopecia, Scalp treated with Finasteride  1 mg daily.   History of Basal Call Carcinoma, Skin followed by Dermatology.   Labs 08/20/2023 CBC: WNL CMP: WNL except for Glucose   Colonoscopy 12/12/2022 biopsied one diminutive semi-sessile polyp, found to be a tubular adenoma per pathology; noted perianal hemorrhoids; exam otherwise normal with repeat recommendation of 2034.  PSA  0.18  08/20/2023  Vaccine Counseling: Due for Covid-19 and Shingles 1/2; UTD on Flu and Tdap. 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 Social History   Social History Narrative   2025 - Married. 3 children - 1 daughter (recently married), 2 sons. Non-smoker, social alcohol consumption. 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  Review of Systems  Constitutional:  Negative for chills, fever, malaise/fatigue and weight loss.  HENT:  Positive for tinnitus. Negative for hearing loss, sinus pain and sore throat.   Respiratory:  Negative for cough, hemoptysis and shortness of breath.   Cardiovascular:  Negative for chest pain, palpitations, leg swelling and PND.  Gastrointestinal:  Negative for abdominal pain, constipation, diarrhea, heartburn, nausea and vomiting.  Genitourinary:  Negative for dysuria, frequency and urgency.  Musculoskeletal:  Negative for back pain, myalgias and  neck pain.  Skin:  Negative for itching and rash.  Neurological:  Negative for dizziness, tingling, seizures and headaches.  Endo/Heme/Allergies:  Negative for polydipsia.  Psychiatric/Behavioral:  Negative for depression. The patient is not nervous/anxious.     Objective:  Vitals: BP 100/70   Pulse 85   Ht 6' 1.5" (1.867 m)   Wt 196 lb (88.9 kg)   SpO2 98%   BMI 25.51 kg/m  Physical  Exam Vitals and nursing note reviewed.  Constitutional:      General: He is awake. He is not in acute distress.    Appearance: Normal appearance. He is not ill-appearing or toxic-appearing.  HENT:     Head: Normocephalic and atraumatic.     Right Ear: Tympanic membrane, ear canal and external ear normal.     Left Ear: Tympanic membrane, ear canal and external ear normal.     Mouth/Throat:     Pharynx: Oropharynx is clear.  Eyes:     Extraocular Movements: Extraocular movements intact.     Pupils: Pupils are equal, round, and reactive to light.  Neck:     Thyroid: No thyroid mass, thyromegaly or thyroid tenderness.     Vascular: No carotid bruit.  Cardiovascular:     Rate and Rhythm: Normal rate and regular rhythm. No extrasystoles are present.    Pulses:          Dorsalis pedis pulses are 1+ on the right side and 1+ on the left side.     Heart sounds: Normal heart sounds. No murmur heard.    No friction rub. No gallop.  Pulmonary:     Effort: Pulmonary effort is normal.     Breath sounds: Normal breath sounds. No decreased breath sounds, wheezing, rhonchi or rales.  Chest:     Chest wall: No mass.  Abdominal:     Palpations: Abdomen is soft. There is no hepatomegaly, splenomegaly or mass.     Tenderness: There is no abdominal tenderness.     Hernia: No hernia is present.  Genitourinary:    Prostate: Normal.  Musculoskeletal:     Cervical back: Normal range of motion.     Right lower leg: No edema.     Left lower leg: No edema.  Lymphadenopathy:     Cervical: No cervical adenopathy.     Upper Body:     Right upper body: No supraclavicular adenopathy.     Left upper body: No supraclavicular adenopathy.  Skin:    General: Skin is warm and dry.  Neurological:     General: No focal deficit present.     Mental Status: He is alert and oriented to person, place, and time. Mental status is at baseline.     Cranial Nerves: Cranial nerves 2-12 are intact.     Sensory: Sensation  is intact.     Motor: Motor function is intact.     Coordination: Coordination is intact.     Gait: Gait is intact.     Deep Tendon Reflexes: Reflexes are normal and symmetric.  Psychiatric:        Attention and Perception: Attention normal.        Mood and Affect: Mood normal.        Speech: Speech normal.        Behavior: Behavior normal. Behavior is cooperative.        Thought Content: Thought content normal.        Cognition and Memory: Cognition and memory normal.  Judgment: Judgment normal.   Most Recent Fall Risk Assessment:    05/10/2020   10:05 AM  Fall Risk   Falls in the past year? 0  Number falls in past yr: 0  Injury with Fall? 0  Follow up Falls evaluation completed   Most Recent Depression Screenings:    08/23/2023    2:06 PM 05/30/2021   11:14 AM  PHQ 2/9 Scores  PHQ - 2 Score 0 0   Results:  Studies Obtained And Personally Reviewed By Me:  Colonoscopy 12/12/2022 biopsied one diminutive semi-sessile polyp, found to be a tubular adenoma per pathology; noted perianal hemorrhoids; exam otherwise normal.  Labs:     Component Value Date/Time   NA 140 08/20/2023 0909   NA 142 07/30/2022 0821   K 4.3 08/20/2023 0909   CL 103 08/20/2023 0909   CO2 31 08/20/2023 0909   GLUCOSE 104 (H) 08/20/2023 0909   BUN 15 08/20/2023 0909   BUN 15 07/30/2022 0821   CREATININE 1.05 08/20/2023 0909   CALCIUM  9.8 08/20/2023 0909   PROT 6.8 08/20/2023 0909   PROT 6.5 06/07/2023 0801   ALBUMIN 4.5 06/07/2023 0801   AST 26 08/20/2023 0909   ALT 28 08/20/2023 0909   ALKPHOS 67 06/07/2023 0801   BILITOT 1.0 08/20/2023 0909   BILITOT 0.6 06/07/2023 0801   GFRNONAA 87 05/06/2020 0942   GFRAA 101 05/06/2020 0942    Lab Results  Component Value Date   WBC 4.3 08/20/2023   HGB 16.1 08/20/2023   HCT 48.5 08/20/2023   MCV 90.5 08/20/2023   PLT 206 08/20/2023   Lab Results  Component Value Date   CHOL 75 08/20/2023   HDL 50 08/20/2023   LDLCALC 14 08/20/2023   TRIG  40 08/20/2023   CHOLHDL 1.5 08/20/2023       LPA         105             06/07/2023  Lab Results  Component Value Date   HGBA1C 5.6 08/09/2022    Lab Results  Component Value Date   PSA 0.18 08/20/2023   PSA 0.10 08/09/2022   PSA 0.19 05/18/2021   Assessment & Plan:  Other Labs Reviewed today: CBC: WNL CMP: stable   Hyperlipidemia treated with Rosuvastatin  40 mg daily, Zetia  10 mg daily, and Repatha  140 mg injected SQ every 14 days. On 06/07/2023 LPA: 105.   On 08/20/2023 Lipid Panel:normal.  2021 Coronary Calcium  Score: 233. Followed by Dr. Alda Amas, Cardiologist, whom he saw on 08/03/2022 and is scheduled for follow-up soon.   Alopecia, Scalp treated with Finasteride  1 mg daily.   History of Basal Call Carcinoma, Skin followed by Dermatology.   Tinnitus discussed.  Colonoscopy 12/12/2022 biopsied one diminutive semi-sessile polyp, found to be a tubular adenoma per pathology; noted perianal hemorrhoids; exam otherwise normal with repeat recommendation of 2034.  PSA  checked  was  0.18  on  08/20/2023  Vaccine Counseling: Due for Covid-19 and Shingles 1/2; UTD on Flu and Tdap.  Return in 1 year (on 08/22/2024) for annual labs and then on 08/28/2024 for annual visit.  Continue current meds   Annual wellness visit done today including the all of the following: Reviewed patient's Family Medical History Reviewed and updated list of patient's medical providers Assessment of cognitive impairment was done Assessed patient's functional ability Established a written schedule for health screening services Health Risk Assessent Completed and Reviewed  Discussed health benefits of physical activity, and encouraged  him to engage in regular exercise appropriate for his age and condition.    I,Emily Lagle,acting as a Neurosurgeon for Sylvan Evener, MD.,have documented all relevant documentation on the behalf of Sylvan Evener, MD,as directed by  Sylvan Evener, MD while in the presence of Sylvan Evener, MD.   I, Sylvan Evener, MD, have reviewed all documentation for this visit. The documentation on 08/24/23 for the exam, diagnosis, procedures, and orders are all accurate and complete.

## 2023-08-23 ENCOUNTER — Encounter: Payer: Self-pay | Admitting: Internal Medicine

## 2023-08-23 ENCOUNTER — Ambulatory Visit (INDEPENDENT_AMBULATORY_CARE_PROVIDER_SITE_OTHER): Admitting: Internal Medicine

## 2023-08-23 ENCOUNTER — Encounter: Admitting: Internal Medicine

## 2023-08-23 VITALS — BP 100/70 | HR 85 | Ht 73.5 in | Wt 196.0 lb

## 2023-08-23 DIAGNOSIS — Z0000 Encounter for general adult medical examination without abnormal findings: Secondary | ICD-10-CM

## 2023-08-23 DIAGNOSIS — Z860101 Personal history of adenomatous and serrated colon polyps: Secondary | ICD-10-CM | POA: Diagnosis not present

## 2023-08-23 DIAGNOSIS — Z8249 Family history of ischemic heart disease and other diseases of the circulatory system: Secondary | ICD-10-CM

## 2023-08-23 DIAGNOSIS — Z823 Family history of stroke: Secondary | ICD-10-CM

## 2023-08-23 DIAGNOSIS — L659 Nonscarring hair loss, unspecified: Secondary | ICD-10-CM | POA: Diagnosis not present

## 2023-08-23 DIAGNOSIS — Z1211 Encounter for screening for malignant neoplasm of colon: Secondary | ICD-10-CM | POA: Diagnosis not present

## 2023-08-23 LAB — IFOBT (OCCULT BLOOD): IFOBT: NEGATIVE

## 2023-08-23 NOTE — Patient Instructions (Addendum)
 It was a pleasure to see you today. Labs are stable. Return in one year or as needed. Continue follow up with Cardiology and continue same medications.

## 2023-09-05 ENCOUNTER — Telehealth: Payer: Self-pay | Admitting: Pharmacy Technician

## 2023-09-05 NOTE — Telephone Encounter (Signed)
 Pharmacy Patient Advocate Encounter   Received notification from CoverMyMeds that prior authorization for repatha  140mg  is required/requested.   Insurance verification completed.   The patient is insured through [facility] .   Per test claim: PA required; PA submitted to above mentioned insurance via CoverMyMeds Key/confirmation #/EOC [facility] Status is pending

## 2023-09-06 NOTE — Telephone Encounter (Signed)
   I called [facility] [phone]. They said they can get it to go through now.

## 2023-09-28 ENCOUNTER — Other Ambulatory Visit: Payer: Self-pay | Admitting: Cardiology

## 2023-10-12 ENCOUNTER — Other Ambulatory Visit: Payer: Self-pay | Admitting: Cardiology

## 2023-10-16 ENCOUNTER — Encounter: Payer: Self-pay | Admitting: Cardiology

## 2023-10-20 NOTE — Progress Notes (Unsigned)
 Cardiology Office Note:    Date:  10/22/2023   ID:  English Craighead, DOB 15-Nov-1960, MRN [id number]  PCP:  Perri Ronal PARAS, MD  Cardiologist:  None  Electrophysiologist:  None   Referring MD: Perri Ronal PARAS, MD   Chief Complaint  Patient presents with   Coronary Artery Disease    History of Present Illness:    Steve Huynh is a 63 y.o. male with a hx of basal cell carcinoma, hyperlipidemia, family history of CAD who presents for follow-up.  He was referred by Dr. Perri for cardiovascular risk assessment, initially seen on 04/27/2019.  Father underwent PCI in his early 86s and had a stroke at age 45.    Calcium  score on 05/28/2019 was 233 (86 percentile).   Since last clinic visit, he reports he is doing well.  Denies any chest pain, dyspnea, lightheadedness, syncope, lower extremity edema, or palpitations. Continues to exercise regularly.  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   ezetimibe  (ZETIA ) 10 MG tablet TAKE 1 TABLET BY MOUTH DAILY   finasteride  (PROPECIA ) 1 MG tablet TAKE 1 TABLET(1 MG) BY MOUTH DAILY   REPATHA  SURECLICK 140 MG/ML SOAJ  ADMINISTER 1 ML UNDER THE SKIN EVERY 14 DAYS   rosuvastatin  (CRESTOR ) 40 MG tablet Take 1 tablet (40 mg total) by mouth daily.     Allergies:   Patient has no known allergies.   Social History   Socioeconomic History   Marital status: Married    Spouse name: Not on file   Number of children: Not on file   Years of education: Not on file   Highest education level: Master's degree (e.g., MA, MS, MEng, MEd, MSW, MBA)  Occupational History   Not on file  Tobacco Use   Smoking status: Never   Smokeless tobacco: Never  Substance and Sexual Activity   Alcohol use: Not Currently    Comment: NO   Drug use: Not on file   Sexual activity: Not on file  Other Topics Concern   Not on file  Social History Narrative   2025 - Married. 3 children - 1 daughter (recently married), 2 sons. Non-smoker, social alcohol consumption. 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   Social Drivers of Corporate investment banker Strain: Low Risk  (08/19/2023)   Overall Financial Resource Strain (CARDIA)    Difficulty of Paying Living Expenses: Not hard at all  Food Insecurity: No Food Insecurity (08/19/2023)   Hunger Vital Sign  Worried About Programme researcher, broadcasting/film/video in the Last Year: Never true    Ran Out of Food in the Last Year: Never true  Transportation Needs: No Transportation Needs (08/19/2023)   PRAPARE - Administrator, Civil Service (Medical): No    Lack of Transportation (Non-Medical): No  Physical Activity: Sufficiently Active (08/19/2023)   Exercise Vital Sign    Days of Exercise per Week: 3 days    Minutes of Exercise per Session: 60 min  Stress: No Stress Concern Present (08/19/2023)   [employer] of Occupational Health - Occupational Stress Questionnaire    Feeling of Stress : Not at all  Social Connections: Socially Integrated (08/19/2023)   Social Connection and Isolation Panel    Frequency of Communication with Friends and Family:  More than three times a week    Frequency of Social Gatherings with Friends and Family: More than three times a week    Attends Religious Services: More than 4 times per year    Active Member of [employer] or Organizations: Yes    Attends Engineer, structural: More than 4 times per year    Marital Status: Married     Family History: The patient's family history includes Heart attack in his father; Kidney failure in his father; Stroke in his father.  ROS:   Please see the history of present illness.     All other systems reviewed and are negative.  EKGs/Labs/Other Studies Reviewed:    The following studies were reviewed today:   EKG:   06/08/21: Normal sinus rhythm, rate 83, no ST abnormalities 08/03/2022: Normal sinus rhythm, rate 75, no ST abnormality 10/22/2023: Normal sinus rhythm, rate 79, no ST abnormalities   Recent Labs: 08/20/2023: ALT 28; BUN 15; Creat 1.05; Hemoglobin 16.1; Platelets 206; Potassium 4.3; Sodium 140  Recent Lipid Panel    Component Value Date/Time   CHOL 75 08/20/2023 0909   CHOL 68 (L) 06/07/2023 0801   TRIG 40 08/20/2023 0909   HDL 50 08/20/2023 0909   HDL 49 06/07/2023 0801   CHOLHDL 1.5 08/20/2023 0909   LDLCALC 14 08/20/2023 0909    Physical Exam:    VS:  BP 130/78 (BP Location: Right Arm)   Pulse 79   Ht 6' 1 (1.854 m)   Wt 197 lb (89.4 kg)   SpO2 97%   BMI 25.99 kg/m     Wt Readings from Last 3 Encounters:  10/22/23 197 lb (89.4 kg)  08/23/23 196 lb (88.9 kg)  08/10/22 201 lb (91.2 kg)     GEN:  Well nourished, well developed in no acute distress HEENT: Normal NECK: No JVD CARDIAC: RRR, no murmurs, rubs, gallops RESPIRATORY:  Clear to auscultation without rales, wheezing or rhonchi  ABDOMEN: Soft, non-tender, non-distended MUSCULOSKELETAL:  No edema; No deformity  SKIN: Warm and dry NEUROLOGIC:  Alert and oriented x 3 PSYCHIATRIC:  Normal affect   ASSESSMENT:    1. CAD in native artery   2. Hyperlipidemia,  unspecified hyperlipidemia type      PLAN:    CAD: Calcium  score on 05/28/2019 was 233 (86 percentile).  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  Discussed with Dr. Mona in lipid clinic and currently there are no guidelines for treating Lp(a) when LDL at goal, though this is being studied and is hypothesized to be beneficial  to lower Lp(a) even when LDL at goal.  He elected to pursue this, was referred to lipid clinic and started on Repatha  -On Repatha , zetia , and rosuvastatin .  LP(a) improved to 105 on 05/2023 and LDL 14 on 07/2023  RTC 1 year  Medication Adjustments/Labs and Tests Ordered: Current medicines are reviewed at length with the patient today.  Concerns regarding medicines are outlined above.  Orders Placed This Encounter  Procedures   EKG 12-Lead   No orders of the defined types were placed in this encounter.   Patient Instructions  Medication Instructions:  Continue current medications *If you need a refill on your cardiac medications before your next appointment, please call your pharmacy*  Lab Work: none If you have labs (blood work) drawn today and your tests are completely normal, you will receive your results only by: MyChart Message (if you have MyChart) OR A paper copy in the mail If you have any lab test that is abnormal or we need to change your treatment, we will call you to review the results.  Testing/Procedures: none  Follow-Up: At [facility], you and your health needs are our priority.  As part of our continuing mission to provide you with exceptional heart care, our providers are all part of one team.  This team includes your primary Cardiologist (physician) and Advanced Practice Providers or APPs (Physician Assistants and Nurse Practitioners) who all work together to provide you with  the care you need, when you need it.  Your next appointment:   1 year(s)  Provider:   Dr. Kate  We recommend signing up for the patient portal called MyChart.  Sign up information is provided on this After Visit Summary.  MyChart is used to connect with patients for Virtual Visits (Telemedicine).  Patients are able to view lab/test results, encounter notes, upcoming appointments, etc.  Non-urgent messages can be sent to your provider as well.   To learn more about what you can do with MyChart, go to [email or web address].   Other Instructions none       Signed, Lonni LITTIE Kate, MD  10/22/2023 11:24 AM    [facility]

## 2023-10-22 ENCOUNTER — Ambulatory Visit: Attending: Internal Medicine | Admitting: Cardiology

## 2023-10-22 VITALS — BP 130/78 | HR 79 | Ht 73.0 in | Wt 197.0 lb

## 2023-10-22 DIAGNOSIS — I2510 Atherosclerotic heart disease of native coronary artery without angina pectoris: Secondary | ICD-10-CM | POA: Diagnosis not present

## 2023-10-22 DIAGNOSIS — E785 Hyperlipidemia, unspecified: Secondary | ICD-10-CM | POA: Diagnosis not present

## 2023-10-22 NOTE — Patient Instructions (Signed)
 Medication Instructions:  Continue current medications *If you need a refill on your cardiac medications before your next appointment, please call your pharmacy*  Lab Work: none If you have labs (blood work) drawn today and your tests are completely normal, you will receive your results only by: MyChart Message (if you have MyChart) OR A paper copy in the mail If you have any lab test that is abnormal or we need to change your treatment, we will call you to review the results.  Testing/Procedures: none  Follow-Up: At [facility], you and your health needs are our priority.  As part of our continuing mission to provide you with exceptional heart care, our providers are all part of one team.  This team includes your primary Cardiologist (physician) and Advanced Practice Providers or APPs (Physician Assistants and Nurse Practitioners) who all work together to provide you with the care you need, when you need it.  Your next appointment:   1 year(s)  Provider:   Dr. Alda Amas  We recommend signing up for the patient portal called "MyChart".  Sign up information is provided on this After Visit Summary.  MyChart is used to connect with patients for Virtual Visits (Telemedicine).  Patients are able to view lab/test results, encounter notes, upcoming appointments, etc.  Non-urgent messages can be sent to your provider as well.   To learn more about what you can do with MyChart, go to [email or web address].   Other Instructions none

## 2023-10-26 ENCOUNTER — Other Ambulatory Visit: Payer: Self-pay | Admitting: Cardiology

## 2023-11-04 ENCOUNTER — Ambulatory Visit: Admitting: Internal Medicine

## 2023-11-04 VITALS — BP 120/80 | HR 97 | Ht 73.0 in | Wt 197.0 lb

## 2023-11-04 DIAGNOSIS — H1033 Unspecified acute conjunctivitis, bilateral: Secondary | ICD-10-CM

## 2023-11-04 DIAGNOSIS — L03211 Cellulitis of face: Secondary | ICD-10-CM | POA: Diagnosis not present

## 2023-11-04 MED ORDER — OFLOXACIN 0.3 % OP SOLN: 2.0000 [drp] | Freq: Four times a day (QID) | OPHTHALMIC | 1 refills | Status: AC

## 2023-11-04 MED ORDER — CEFADROXIL 500 MG PO CAPS: 500.0000 mg | ORAL_CAPSULE | Freq: Two times a day (BID) | ORAL | 0 refills | Status: AC

## 2023-11-04 NOTE — Progress Notes (Signed)
 Patient Care Team: Perri Ronal PARAS, MD as PCP - General (Internal Medicine) Porter Andrez SAUNDERS, PA-C (Inactive) as Physician Assistant (Dermatology)  Visit Date: 11/04/23  Subjective:   Chief Complaint  Patient presents with   Eye Pain    Left eye started last Monday, eye swelling. Tearing making his vision blurry.    eye swelling   Patient PI:Vlpwu Lyons,Male DOB:[birth year],62 y.o. FMW:[id number]   63 y.o.Male presents today for acute sick visit with Eye Pain. Patient has a past medical history of IFG; Basal Cell Carcinoma; HLD. Left eye swelling w/ associated pain and tearing reportedly began on 10/28/2023. Says that he has been using an ice pack for the swelling. Prior to his symptoms, says he had cryosurgery on some spots on his face, with an area near to his left eye being removed and that afternoon he began to experience swelling and irritation. 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   center chest    No Known Allergies Immunization History  Administered Date(s) Administered   Influenza,inj,Quad PF,6+ Mos 01/06/2014, 03/31/2017, 11/10/2017, 11/22/2018, 12/25/2018   Influenza-Unspecified 01/06/2014, 03/31/2017, 11/10/2017, 01/27/2020, 01/16/2021   PFIZER(Purple Top)SARS-COV-2 Vaccination 06/01/2019, 06/22/2019, 02/23/2020   PNEUMOCOCCAL CONJUGATE-20 08/10/2022   Tdap 10/06/2012,  06/11/2017   Past Surgical History:  Procedure Laterality Date   ARTHROSCOPIC REPAIR ACL  03/26/2009    Family History  Problem Relation Age of Onset   Stroke Father    Heart attack Father    Kidney failure Father    Social History   Social History Narrative   2025 - Married. 3 children - 1 daughter (recently married), 2 sons. Non-smoker, social alcohol consumption. 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   Review of Systems  Constitutional:  Negative for fever and malaise/fatigue.  HENT:  Negative for congestion.        (+) Facial Pain, left side w/ swelling  Eyes:  Positive for blurred vision and pain.  Respiratory:  Negative for cough and shortness of breath.   Cardiovascular:  Negative for chest pain, palpitations and leg swelling.  Gastrointestinal:  Negative for vomiting.  Musculoskeletal:  Negative for back pain.  Skin:  Negative for rash.  Neurological:  Negative for loss of consciousness and headaches.     Objective:  Vitals: body mass index is 25.99 kg/m. Today's Vitals   11/04/23 1149  BP: 120/80  Pulse: 97  SpO2: 98%  Weight: 197 lb (89.4 kg)  Height: 6' 1 (1.854 m)  PainSc: 3   PainLoc: Eye   Physical Exam Vitals and nursing note reviewed.  Constitutional:      General: He is not in acute distress.    Appearance: Normal appearance. He is not ill-appearing.  HENT:     Head: Normocephalic and atraumatic.  Comments: Swelling left-sided of face Eyes:     General:        Right eye: Discharge present.        Left eye: Discharge present.    Conjunctiva/sclera:     Right eye: Right conjunctiva is injected.     Left eye: Left conjunctiva is injected.     Pupils: Pupils are equal, round, and reactive to light.  Pulmonary:     Effort: Pulmonary effort is normal.  Skin:    General: Skin is warm and dry.  Neurological:     Mental Status: He is alert and oriented to person, place, and time. Mental status is  at baseline.  Psychiatric:        Mood and Affect: Mood normal.        Behavior: Behavior normal.        Thought Content: Thought content normal.        Judgment: Judgment normal.     Results:  Studies Obtained And Personally Reviewed By Me: Labs:  CBC w/ Differential Lab Results  Component Value Date   WBC 4.3 08/20/2023   RBC 5.36 08/20/2023   HGB 16.1 08/20/2023   HCT 48.5 08/20/2023   PLT 206 08/20/2023   MCV 90.5 08/20/2023   MCH 30.0 08/20/2023   MCHC 33.2 08/20/2023   RDW 12.5 08/20/2023   MPV 11.2 08/20/2023   LYMPHSABS 2,011 08/09/2022   BASOSABS 22 08/20/2023    Comprehensive Metabolic Panel Lab Results  Component Value Date   NA 140 08/20/2023   K 4.3 08/20/2023   CL 103 08/20/2023   CO2 31 08/20/2023   GLUCOSE 104 (H) 08/20/2023   BUN 15 08/20/2023   CREATININE 1.05 08/20/2023   CALCIUM  9.8 08/20/2023   PROT 6.8 08/20/2023   ALBUMIN 4.5 06/07/2023   AST 26 08/20/2023   ALT 28 08/20/2023   ALKPHOS 67 06/07/2023   BILITOT 1.0 08/20/2023   EGFR 84 08/09/2022   GFRNONAA 87 05/06/2020   Lipid Panel  Lab Results  Component Value Date   CHOL 75 08/20/2023   HDL 50 08/20/2023   LDLCALC 14 08/20/2023   TRIG 40 08/20/2023   A1c Lab Results  Component Value Date   HGBA1C 5.6 08/09/2022    PSA Lab Results  Component Value Date   PSA 0.18 08/20/2023   PSA 0.10 08/09/2022   PSA 0.19 05/18/2021     Assessment & Plan:   Meds ordered this encounter  Medications   ofloxacin  (OCUFLOX ) 0.3 % ophthalmic solution    Sig: Place 2 drops into both eyes 4 (four) times daily.    Dispense:  5 mL    Refill:  1   cefadroxil  (DURICEF) 500 MG capsule    Sig: Take 1 capsule (500 mg total) by mouth 2 (two) times daily.    Dispense:  14 capsule    Refill:  0   Acute Bacterial Conjunctivitis, Both Eyes: symptoms reportedly began on 10/28/2023 shortly following cryosurgery on some spots on his face, with an area near to his left eye being removed.   Sending in  Ocuflox  eyedrops - use 2 drops into both eyes 4 times a day for 5 days.    Facial Cellulitis: associated with his conjunctivitis.   Sending in Cefadroxil  500 mg to take twice daily for 7 days.    I,Emily Lagle,acting as a Neurosurgeon for Ronal JINNY Hailstone, MD.,have documented all relevant documentation on the behalf of Ronal JINNY Hailstone, MD,as directed by  Ronal JINNY  Opie Maclaughlin, MD while in the presence of Ronal JINNY Hailstone, MD.  I, Ronal JINNY Hailstone, MD, have reviewed all documentation for this visit. The documentation on 11/04/2023 for the exam, diagnosis, procedures, and orders are all accurate and complete.

## 2023-11-04 NOTE — Progress Notes (Signed)
 Patient Care Team: Perri Ronal PARAS, MD as PCP - General (Internal Medicine) Porter Andrez SAUNDERS, PA-C (Inactive) as Physician Assistant (Dermatology)  Visit Date: 11/04/23  Subjective:   Chief Complaint  Patient presents with   Eye Pain    Left eye started last Monday, eye swelling. Tearing making his vision blurry.    eye swelling   Patient PI:Steve Huynh,Male DOB:[birth year],62 y.o. FMW:[id number]   63 y.o.Male presents today for acute sick visit with Eye Pain. Patient has a past medical history of IFG; Basal Cell Carcinoma; HLD. Left eye swelling w/ associated pain and tearing reportedly began on 10/28/2023. Says that he has been using an ice pack for the swelling. Prior to his symptoms, says he had cryosurgery on some spots on his face, with an area near to his left eye being removed and that afternoon he began to experience swelling and irritation. 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   center chest    No Known Allergies Immunization History  Administered Date(s) Administered   Influenza,inj,Quad PF,6+ Mos 01/06/2014, 03/31/2017, 11/10/2017, 11/22/2018, 12/25/2018   Influenza-Unspecified 01/06/2014, 03/31/2017, 11/10/2017, 01/27/2020, 01/16/2021   PFIZER(Purple Top)SARS-COV-2 Vaccination 06/01/2019, 06/22/2019, 02/23/2020   PNEUMOCOCCAL CONJUGATE-20 08/10/2022   Tdap 10/06/2012,  06/11/2017   Past Surgical History:  Procedure Laterality Date   ARTHROSCOPIC REPAIR ACL  03/26/2009    Family History  Problem Relation Age of Onset   Stroke Father    Heart attack Father    Kidney failure Father    Social History   Social History Narrative   2025 - Married. 3 children - 1 daughter (recently married), 2 sons. Non-smoker, social alcohol consumption. 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   Review of Systems  Constitutional:  Negative for fever and malaise/fatigue.  HENT:  Negative for congestion.        (+) Facial Pain, left side w/ swelling  Eyes:  Positive for blurred vision and pain.  Respiratory:  Negative for cough and shortness of breath.   Cardiovascular:  Negative for chest pain, palpitations and leg swelling.  Gastrointestinal:  Negative for vomiting.  Musculoskeletal:  Negative for back pain.  Skin:  Negative for rash.  Neurological:  Negative for loss of consciousness and headaches.     Objective:  Vitals: body mass index is 25.99 kg/m. Today's Vitals   11/04/23 1149  BP: 120/80  Pulse: 97  SpO2: 98%  Weight: 197 lb (89.4 kg)  Height: 6' 1 (1.854 m)  PainSc: 3   PainLoc: Eye   Physical Exam Vitals and nursing note reviewed.  Constitutional:      General: He is not in acute distress.    Appearance: Normal appearance. He is not ill-appearing.  HENT:     Head: Normocephalic and atraumatic.  Comments: Swelling left-sided of face Eyes:     General:        Right eye: Discharge present.        Left eye: Discharge present.    Conjunctiva/sclera:     Right eye: Right conjunctiva is injected.     Left eye: Left conjunctiva is injected.     Pupils: Pupils are equal, round, and reactive to light.  Pulmonary:     Effort: Pulmonary effort is normal.  Skin:    General: Skin is warm and dry.  Neurological:     Mental Status: He is alert and oriented to person, place, and time. Mental status is  at baseline.  Psychiatric:        Mood and Affect: Mood normal.        Behavior: Behavior normal.        Thought Content: Thought content normal.        Judgment: Judgment normal.     Results:  Studies Obtained And Personally Reviewed By Me: Labs:  CBC w/ Differential Lab Results  Component Value Date   WBC 4.3 08/20/2023   RBC 5.36 08/20/2023   HGB 16.1 08/20/2023   HCT 48.5 08/20/2023   PLT 206 08/20/2023   MCV 90.5 08/20/2023   MCH 30.0 08/20/2023   MCHC 33.2 08/20/2023   RDW 12.5 08/20/2023   MPV 11.2 08/20/2023   LYMPHSABS 2,011 08/09/2022   BASOSABS 22 08/20/2023    Comprehensive Metabolic Panel Lab Results  Component Value Date   NA 140 08/20/2023   K 4.3 08/20/2023   CL 103 08/20/2023   CO2 31 08/20/2023   GLUCOSE 104 (H) 08/20/2023   BUN 15 08/20/2023   CREATININE 1.05 08/20/2023   CALCIUM  9.8 08/20/2023   PROT 6.8 08/20/2023   ALBUMIN 4.5 06/07/2023   AST 26 08/20/2023   ALT 28 08/20/2023   ALKPHOS 67 06/07/2023   BILITOT 1.0 08/20/2023   EGFR 84 08/09/2022   GFRNONAA 87 05/06/2020   Lipid Panel  Lab Results  Component Value Date   CHOL 75 08/20/2023   HDL 50 08/20/2023   LDLCALC 14 08/20/2023   TRIG 40 08/20/2023   A1c Lab Results  Component Value Date   HGBA1C 5.6 08/09/2022    PSA Lab Results  Component Value Date   PSA 0.18 08/20/2023   PSA 0.10 08/09/2022   PSA 0.19 05/18/2021     Assessment & Plan:   Meds ordered this encounter  Medications   ofloxacin  (OCUFLOX ) 0.3 % ophthalmic solution    Sig: Place 2 drops into both eyes 4 (four) times daily.    Dispense:  5 mL    Refill:  1   cefadroxil  (DURICEF) 500 MG capsule    Sig: Take 1 capsule (500 mg total) by mouth 2 (two) times daily.    Dispense:  14 capsule    Refill:  0   Acute Bacterial Conjunctivitis, Both Eyes: symptoms reportedly began on 10/28/2023 shortly following cryosurgery on some spots on his face, with an area near to his left eye being removed.   Sending in  Ocuflox  eyedrops - use 2 drops into both eyes 4 times a day for 5 days.    Facial Cellulitis: associated with his conjunctivitis.   Sending in Cefadroxil  500 mg to take twice daily for 7 days.    I,Emily Lagle,acting as a Neurosurgeon for Ronal JINNY Hailstone, MD.,have documented all relevant documentation on the behalf of Ronal JINNY Hailstone, MD,as directed by  Ronal JINNY  Opie Maclaughlin, MD while in the presence of Ronal JINNY Hailstone, MD.  I, Ronal JINNY Hailstone, MD, have reviewed all documentation for this visit. The documentation on 11/04/2023 for the exam, diagnosis, procedures, and orders are all accurate and complete.

## 2023-11-05 NOTE — Patient Instructions (Signed)
 Please use Ocuflox  ophthalmic drops 2 drops each eye 4 times a day for 5 days. Please take Cefadroxil  500 mg daily for 7 days for facial cellulitis.

## 2023-11-19 ENCOUNTER — Other Ambulatory Visit: Payer: Self-pay | Admitting: Cardiology

## 2023-12-01 ENCOUNTER — Other Ambulatory Visit: Payer: Self-pay | Admitting: Internal Medicine

## 2024-01-17 ENCOUNTER — Other Ambulatory Visit: Payer: Self-pay | Admitting: Cardiology

## 2024-01-17 NOTE — Telephone Encounter (Signed)
 Refill Request.

## 2024-01-23 ENCOUNTER — Ambulatory Visit: Admitting: Internal Medicine

## 2024-01-23 ENCOUNTER — Encounter: Payer: Self-pay | Admitting: Internal Medicine

## 2024-01-23 ENCOUNTER — Ambulatory Visit: Payer: Self-pay | Admitting: Internal Medicine

## 2024-01-23 ENCOUNTER — Ambulatory Visit (HOSPITAL_COMMUNITY)
Admission: RE | Admit: 2024-01-23 | Discharge: 2024-01-23 | Disposition: A | Discharge status: Home / Self Care | Source: Ambulatory Visit | Attending: Internal Medicine | Admitting: Internal Medicine

## 2024-01-23 VITALS — Ht 73.0 in | Wt 197.0 lb

## 2024-01-23 DIAGNOSIS — J22 Unspecified acute lower respiratory infection: Secondary | ICD-10-CM

## 2024-01-23 DIAGNOSIS — R058 Other specified cough: Secondary | ICD-10-CM

## 2024-01-23 LAB — CBC WITH DIFFERENTIAL/PLATELET
Absolute Lymphocytes: 1256 {cells}/uL (ref 850–3900)
Absolute Monocytes: 952 {cells}/uL — ABNORMAL HIGH (ref 200–950)
Basophils Absolute: 7 {cells}/uL (ref 0–200)
Basophils Relative: 0.1 %
Eosinophils Absolute: 62 {cells}/uL (ref 15–500)
Eosinophils Relative: 0.9 %
HCT: 45.1 % (ref 38.5–50.0)
Hemoglobin: 15.4 g/dL (ref 13.2–17.1)
MCH: 30.2 pg (ref 27.0–33.0)
MCHC: 34.1 g/dL (ref 32.0–36.0)
MCV: 88.4 fL (ref 80.0–100.0)
MPV: 10.8 fL (ref 7.5–12.5)
Monocytes Relative: 13.8 %
Neutro Abs: 4623 {cells}/uL (ref 1500–7800)
Neutrophils Relative %: 67 %
Platelets: 193 Thousand/uL (ref 140–400)
RBC: 5.1 Million/uL (ref 4.20–5.80)
RDW: 12.2 % (ref 11.0–15.0)
Total Lymphocyte: 18.2 %
WBC: 6.9 Thousand/uL (ref 3.8–10.8)

## 2024-01-23 LAB — POC COVID19/FLU A&B COMBO
Covid Antigen, POC: NEGATIVE
Influenza A Antigen, POC: NEGATIVE
Influenza B Antigen, POC: NEGATIVE

## 2024-01-23 MED ORDER — HYDROCODONE BIT-HOMATROP MBR 5-1.5 MG/5ML PO SOLN: 5.0000 mL | Freq: Three times a day (TID) | ORAL | 0 refills | Status: AC | PRN

## 2024-01-23 MED ORDER — CEFTRIAXONE SODIUM 1 G IJ SOLR
1.0000 g | Freq: Once | INTRAMUSCULAR | Status: AC
  Administered 2024-01-23: 1 g via INTRAMUSCULAR

## 2024-01-23 MED ORDER — AZITHROMYCIN 250 MG PO TABS: ORAL_TABLET | ORAL | 0 refills | Status: AC

## 2024-01-23 NOTE — Progress Notes (Signed)
 Patient Care Team: Perri Ronal PARAS, MD as PCP - General (Internal Medicine) Porter Andrez SAUNDERS, PA-C (Inactive) as Physician Assistant (Dermatology)  Visit Date: 01/23/24  Subjective:    Patient ID: Steve Huynh , Male   DOB: 05-May-1960, 63 y.o.    MRN: [id number]   63 y.o. Male presents today for cough. Patient has a past medical history of Hyperlipidemia, Alopecia.  He began to feel sick on Saturday. He traveled to [facility], [place]  on Sunday and got back today. He has been experiencing headache and fever, which he treated with tylenol, productive cough, runny nose and he has a burning sensation in his lungs. He also says his throat burns. No one else in his household has been sick. Covid and Influenza tests where negative.   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 3 children - 1 daughter (recently married), 2 sons. Non-smoker, social alcohol consumption. 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      Review of Systems  Constitutional:  Positive for fever.  HENT:  Positive for congestion.   Respiratory:  Positive for cough and  sputum production.   Gastrointestinal:  Negative for diarrhea and vomiting.  Neurological:  Positive for headaches.        Objective:   Vitals: Ht 6' 1 (1.854 m)   Wt 197 lb (89.4 kg)   BMI 25.99 kg/m    Physical Exam Vitals and nursing note reviewed.  Constitutional:      General: He is not in acute distress.    Appearance: Normal appearance. He is not ill-appearing.  HENT:     Head: Normocephalic and atraumatic.     Comments: Left ear dull.     Right Ear: Tympanic membrane, ear canal and external ear normal.     Left Ear: Tympanic membrane, ear canal and external ear normal.     Mouth/Throat:     Mouth: Mucous membranes are moist.     Pharynx: Oropharynx is clear. No oropharyngeal exudate or posterior oropharyngeal erythema.  Pulmonary:     Effort: Pulmonary effort is normal.     Breath sounds: Normal breath sounds. No wheezing, rhonchi or rales.     Comments: Congestion in right lung.  Lymphadenopathy:     Cervical: No cervical adenopathy.  Skin:    General: Skin is warm and dry.  Neurological:     Mental Status: He is alert and oriented to person, place, and time. Mental status is at baseline.  Psychiatric:  Mood and Affect: Mood normal.        Behavior: Behavior normal.        Thought Content: Thought content normal.        Judgment: Judgment normal.       Results:   Labs:       Component Value Date/Time   NA 140 08/20/2023 0909   NA 142 07/30/2022 0821   K 4.3 08/20/2023 0909   CL 103 08/20/2023 0909   CO2 31 08/20/2023 0909   GLUCOSE 104 (H) 08/20/2023 0909   BUN 15 08/20/2023 0909   BUN 15 07/30/2022 0821   CREATININE 1.05 08/20/2023 0909   CALCIUM  9.8 08/20/2023 0909   PROT 6.8 08/20/2023 0909   PROT 6.5 06/07/2023 0801   ALBUMIN 4.5 06/07/2023 0801   AST 26 08/20/2023 0909   ALT 28 08/20/2023 0909   ALKPHOS 67 06/07/2023 0801   BILITOT 1.0 08/20/2023 0909   BILITOT 0.6 06/07/2023 0801   GFRNONAA 87 05/06/2020 0942   GFRAA 101  05/06/2020 0942     Lab Results  Component Value Date   WBC 4.3 08/20/2023   HGB 16.1 08/20/2023   HCT 48.5 08/20/2023   MCV 90.5 08/20/2023   PLT 206 08/20/2023    Lab Results  Component Value Date   CHOL 75 08/20/2023   HDL 50 08/20/2023   LDLCALC 14 08/20/2023   TRIG 40 08/20/2023   CHOLHDL 1.5 08/20/2023    Lab Results  Component Value Date   HGBA1C 5.6 08/09/2022      Lab Results  Component Value Date   PSA 0.18 08/20/2023   PSA 0.10 08/09/2022   PSA 0.19 05/18/2021     Assessment & Plan:   Acute Lower Respiratory Infection: began to feel sick on Saturday. He traveled to [facility], [place]  on Sunday and got back today. He has been experiencing headache and fever, which he treated with tylenol, productive cough, runny nose and he has a burning sensation in his lungs. He also says his throat burns. No one else in his household has been sick. Covid and Influenza tests where negative.    Rocephin  1 g IM injection received today.   Azithromycin  250 mg 2 tablets on day 1, One tablet on days 2-5 prescribed.      Hycodan 5 mL every 8 hours as needed prescribed.   Chest X-ray ordered   CBC ordered.     I,Makayla C Reid,acting as a scribe for Ronal JINNY Hailstone, MD.,have documented all relevant documentation on the behalf of Ronal JINNY Hailstone, MD,as directed by  Ronal JINNY Hailstone, MD while in the presence of Ronal JINNY Hailstone, MD.

## 2024-01-24 NOTE — Patient Instructions (Addendum)
 You have been diagnosed with an acute lower respiratory infection.  Chest x-ray is negative for pneumonia.  Labs have been reviewed.  White blood cell count is normal.  Please take Zithromax  Z-PAK 2 tabs day 1 followed by 1 tab days 2 through 5.  You were given 1 g IM Rocephin  in the office which is an antibiotic.  May take Hycodan 1 teaspoon every 8 hours as needed for cough.  Take Tylenol if needed for fever.  Rest at home and stay well-hydrated.  Call if not better in 48 hours or sooner if worse.  We are sorry you are not feeling well.  As always, it was a pleasure to see you today.

## 2024-04-23 ENCOUNTER — Other Ambulatory Visit: Payer: Self-pay

## 2024-04-27 MED ORDER — ROSUVASTATIN CALCIUM 40 MG PO TABS: 40.0000 mg | ORAL_TABLET | Freq: Every day | ORAL | 1 refills | Status: AC

## 2024-08-25 ENCOUNTER — Other Ambulatory Visit: Payer: Self-pay

## 2024-08-28 ENCOUNTER — Encounter: Payer: Self-pay | Admitting: Internal Medicine
